# Patient Record
Sex: Female | Born: 1985 | Race: White | Hispanic: No | Marital: Single | Smoking: Current every day smoker
Health system: Southern US, Community
[De-identification: ages and names within clinical notes are randomized; demographics above are authoritative.]

## PROBLEM LIST (undated history)

## (undated) ENCOUNTER — Inpatient Hospital Stay (HOSPITAL_COMMUNITY): Payer: Self-pay

## (undated) DIAGNOSIS — F1910 Other psychoactive substance abuse, uncomplicated: Secondary | ICD-10-CM

## (undated) DIAGNOSIS — R569 Unspecified convulsions: Secondary | ICD-10-CM

## (undated) DIAGNOSIS — A5901 Trichomonal vulvovaginitis: Secondary | ICD-10-CM

## (undated) DIAGNOSIS — F909 Attention-deficit hyperactivity disorder, unspecified type: Secondary | ICD-10-CM

## (undated) DIAGNOSIS — F319 Bipolar disorder, unspecified: Secondary | ICD-10-CM

## (undated) HISTORY — PX: DILATION AND CURETTAGE OF UTERUS: SHX78

## (undated) HISTORY — DX: Unspecified convulsions: R56.9

---

## 2004-08-24 ENCOUNTER — Emergency Department (HOSPITAL_COMMUNITY): Admission: EM | Admit: 2004-08-24 | Discharge: 2004-08-25 | Payer: Self-pay | Admitting: Emergency Medicine

## 2004-11-25 ENCOUNTER — Observation Stay (HOSPITAL_COMMUNITY): Admission: EM | Admit: 2004-11-25 | Discharge: 2004-11-25 | Payer: Self-pay | Admitting: Emergency Medicine

## 2004-11-28 ENCOUNTER — Emergency Department (HOSPITAL_COMMUNITY): Admission: EM | Admit: 2004-11-28 | Discharge: 2004-11-28 | Payer: Self-pay | Admitting: Emergency Medicine

## 2005-06-09 ENCOUNTER — Emergency Department (HOSPITAL_COMMUNITY): Admission: EM | Admit: 2005-06-09 | Discharge: 2005-06-09 | Payer: Self-pay | Admitting: Emergency Medicine

## 2005-07-06 ENCOUNTER — Emergency Department (HOSPITAL_COMMUNITY): Admission: EM | Admit: 2005-07-06 | Discharge: 2005-07-06 | Payer: Self-pay | Admitting: Emergency Medicine

## 2005-11-28 ENCOUNTER — Emergency Department (HOSPITAL_COMMUNITY): Admission: EM | Admit: 2005-11-28 | Discharge: 2005-11-28 | Payer: Self-pay | Admitting: Emergency Medicine

## 2006-07-01 ENCOUNTER — Emergency Department (HOSPITAL_COMMUNITY): Admission: EM | Admit: 2006-07-01 | Discharge: 2006-07-01 | Payer: Self-pay | Admitting: Emergency Medicine

## 2006-07-02 ENCOUNTER — Emergency Department (HOSPITAL_COMMUNITY): Admission: EM | Admit: 2006-07-02 | Discharge: 2006-07-02 | Payer: Self-pay | Admitting: Emergency Medicine

## 2007-04-12 ENCOUNTER — Emergency Department (HOSPITAL_COMMUNITY): Admission: EM | Admit: 2007-04-12 | Discharge: 2007-04-12 | Payer: Self-pay | Admitting: Emergency Medicine

## 2009-03-17 ENCOUNTER — Inpatient Hospital Stay (HOSPITAL_COMMUNITY): Admission: AD | Admit: 2009-03-17 | Discharge: 2009-03-19 | Payer: Self-pay | Admitting: Obstetrics & Gynecology

## 2009-03-17 ENCOUNTER — Ambulatory Visit: Payer: Self-pay | Admitting: Advanced Practice Midwife

## 2009-09-29 ENCOUNTER — Emergency Department (HOSPITAL_COMMUNITY): Admission: EM | Admit: 2009-09-29 | Discharge: 2009-09-29 | Payer: Self-pay | Admitting: Emergency Medicine

## 2009-09-30 ENCOUNTER — Emergency Department (HOSPITAL_COMMUNITY): Admission: EM | Admit: 2009-09-30 | Discharge: 2009-09-30 | Payer: Self-pay | Admitting: Emergency Medicine

## 2009-12-17 ENCOUNTER — Emergency Department (HOSPITAL_COMMUNITY): Admission: EM | Admit: 2009-12-17 | Discharge: 2009-12-17 | Payer: Self-pay | Admitting: Emergency Medicine

## 2010-07-28 ENCOUNTER — Encounter: Payer: Self-pay | Admitting: Family Medicine

## 2010-09-07 ENCOUNTER — Emergency Department (HOSPITAL_COMMUNITY)
Admission: EM | Admit: 2010-09-07 | Discharge: 2010-09-08 | Disposition: A | Discharge status: Home / Self Care | Payer: Medicaid - Out of State | Attending: Emergency Medicine | Admitting: Emergency Medicine

## 2010-09-07 DIAGNOSIS — X503XXA Overexertion from repetitive movements, initial encounter: Secondary | ICD-10-CM | POA: Insufficient documentation

## 2010-09-07 DIAGNOSIS — Y92009 Unspecified place in unspecified non-institutional (private) residence as the place of occurrence of the external cause: Secondary | ICD-10-CM | POA: Insufficient documentation

## 2010-09-07 DIAGNOSIS — S335XXA Sprain of ligaments of lumbar spine, initial encounter: Secondary | ICD-10-CM | POA: Insufficient documentation

## 2010-09-07 DIAGNOSIS — N390 Urinary tract infection, site not specified: Secondary | ICD-10-CM | POA: Insufficient documentation

## 2010-09-07 DIAGNOSIS — O2390 Unspecified genitourinary tract infection in pregnancy, unspecified trimester: Secondary | ICD-10-CM | POA: Insufficient documentation

## 2010-09-08 LAB — URINALYSIS, ROUTINE W REFLEX MICROSCOPIC
Glucose, UA: NEGATIVE mg/dL
Hgb urine dipstick: NEGATIVE
Specific Gravity, Urine: 1.02 (ref 1.005–1.030)
Urobilinogen, UA: 0.2 mg/dL (ref 0.0–1.0)

## 2010-09-08 LAB — URINE MICROSCOPIC-ADD ON

## 2010-09-19 ENCOUNTER — Emergency Department (HOSPITAL_COMMUNITY)
Admission: EM | Admit: 2010-09-19 | Discharge: 2010-09-19 | Disposition: A | Discharge status: Home / Self Care | Payer: Medicaid - Out of State | Attending: Emergency Medicine | Admitting: Emergency Medicine

## 2010-09-19 DIAGNOSIS — Z4802 Encounter for removal of sutures: Secondary | ICD-10-CM | POA: Insufficient documentation

## 2010-09-19 DIAGNOSIS — B86 Scabies: Secondary | ICD-10-CM | POA: Insufficient documentation

## 2010-09-23 LAB — DIFFERENTIAL
Basophils Relative: 1 % (ref 0–1)
Lymphs Abs: 1.8 10*3/uL (ref 0.7–4.0)
Monocytes Relative: 9 % (ref 3–12)
Neutro Abs: 8.3 10*3/uL — ABNORMAL HIGH (ref 1.7–7.7)
Neutrophils Relative %: 73 % (ref 43–77)

## 2010-09-23 LAB — URINALYSIS, ROUTINE W REFLEX MICROSCOPIC
Bilirubin Urine: NEGATIVE
Glucose, UA: NEGATIVE mg/dL
Hgb urine dipstick: NEGATIVE
Ketones, ur: NEGATIVE mg/dL
Protein, ur: NEGATIVE mg/dL
pH: 5.5 (ref 5.0–8.0)

## 2010-09-23 LAB — COMPREHENSIVE METABOLIC PANEL
Alkaline Phosphatase: 73 U/L (ref 39–117)
BUN: 3 mg/dL — ABNORMAL LOW (ref 6–23)
Calcium: 9.8 mg/dL (ref 8.4–10.5)
Glucose, Bld: 109 mg/dL — ABNORMAL HIGH (ref 70–99)
Total Protein: 7.4 g/dL (ref 6.0–8.3)

## 2010-09-23 LAB — POCT PREGNANCY, URINE: Preg Test, Ur: NEGATIVE

## 2010-09-23 LAB — CBC
HCT: 40.2 % (ref 36.0–46.0)
MCHC: 33.4 g/dL (ref 30.0–36.0)
RBC: 4.58 MIL/uL (ref 3.87–5.11)
RDW: 14.9 % (ref 11.5–15.5)
WBC: 11.4 10*3/uL — ABNORMAL HIGH (ref 4.0–10.5)

## 2010-09-23 LAB — GC/CHLAMYDIA PROBE AMP, GENITAL
Chlamydia, DNA Probe: NEGATIVE
GC Probe Amp, Genital: NEGATIVE

## 2010-09-23 LAB — WET PREP, GENITAL
Trich, Wet Prep: NONE SEEN
Yeast Wet Prep HPF POC: NONE SEEN

## 2010-09-23 LAB — URINE MICROSCOPIC-ADD ON

## 2010-09-29 LAB — COMPREHENSIVE METABOLIC PANEL
ALT: 9 U/L (ref 0–35)
AST: 11 U/L (ref 0–37)
Albumin: 3.5 g/dL (ref 3.5–5.2)
CO2: 25 mEq/L (ref 19–32)
Calcium: 8.9 mg/dL (ref 8.4–10.5)
GFR calc Af Amer: >60 mL/min (ref >60)
GFR calc non Af Amer: >60 mL/min (ref >60)
Sodium: 136 mEq/L (ref 135–145)
Total Protein: 7 g/dL (ref 6.0–8.3)

## 2010-09-29 LAB — DIFFERENTIAL
Eosinophils Absolute: 0.1 10*3/uL (ref 0.0–0.7)
Eosinophils Relative: 1 % (ref 0–5)
Lymphocytes Relative: 18 % (ref 12–46)
Lymphs Abs: 1.7 10*3/uL (ref 0.7–4.0)
Monocytes Absolute: 0.8 10*3/uL (ref 0.1–1.0)
Monocytes Relative: 8 % (ref 3–12)

## 2010-09-29 LAB — CBC
MCHC: 34.6 g/dL (ref 30.0–36.0)
Platelets: 260 10*3/uL (ref 150–400)
RBC: 4.24 MIL/uL (ref 3.87–5.11)

## 2010-10-11 LAB — RAPID URINE DRUG SCREEN, HOSP PERFORMED
Amphetamines: NOT DETECTED
Benzodiazepines: POSITIVE — AB
Tetrahydrocannabinol: NOT DETECTED

## 2010-10-11 LAB — HEPATITIS B SURFACE ANTIGEN: Hepatitis B Surface Ag: NEGATIVE

## 2010-10-11 LAB — RPR: RPR Ser Ql: NONREACTIVE

## 2010-10-11 LAB — RAPID HIV SCREEN (WH-MAU): Rapid HIV Screen: NONREACTIVE

## 2010-10-11 LAB — CBC
Hemoglobin: 12.8 g/dL (ref 12.0–15.0)
RBC: 4.17 MIL/uL (ref 3.87–5.11)

## 2010-10-11 LAB — TYPE AND SCREEN
ABO/RH(D): B POS
Antibody Screen: NEGATIVE

## 2010-11-22 NOTE — H&P (Signed)
NAMEBENNYE, NIX NO.:  [id number]   MEDICAL RECORD NO.:  [id number]          PATIENT TYPE:  OBV   LOCATION:  A416                          FACILITY:  [facility]   PHYSICIAN:  Tilda Burrow, M.D. DATE OF BIRTH:  July 16, 1985   DATE OF ADMISSION:  11/25/2004  DATE OF DISCHARGE:  LH                                HISTORY & PHYSICAL   ADMISSION DIAGNOSES:  Pregnancy 7 weeks' gestation, suspected incomplete  abortion, acknowledged recent cocaine use, sedation secondary to narcotic  administered in the emergency room.   HISTORY OF PRESENT ILLNESS:  This 25 year old female, gravida 1, para 0,  presents to the emergency room in a highly agitated state, complaining of  abdominal pain with heavy vaginal bleeding. CBC was obtained which was in  normal range. She is quantitative HCG of 1,700. Hemoglobin 14.7. She was  agitated and had difficulty cooperating with exam and received morphine for  pain and dramatic sedation effect from the medications.   PHYSICAL EXAMINATION:  VITAL SIGNS:  Blood pressure 137/81, heart rate 112.  She is oriented x3 with normal judgment.  HEENT:  Unremarkable.  ABDOMEN:  Soft, nontender, without masses.  PELVIC:  Pelvic exam shows severe active bleeding, open cervix, tissue in  the os.   PLAN:  Proceed with suction D&C once the patient is alert enough to give  consent. At the time of admission, she could not cooperate with GYN exam but  will proceed if tissue can be extracted we may be able to avoid the OR.      JVF/MEDQ  D:  11/25/2004  T:  11/25/2004  Job:  [id number]

## 2010-11-22 NOTE — H&P (Signed)
Penny Hale, Penny Hale NO.:  [id number]   MEDICAL RECORD NO.:  [id number]          PATIENT TYPE:  OBV   LOCATION:  A416                          FACILITY:  [facility]   PHYSICIAN:  Tilda Burrow, M.D. DATE OF BIRTH:  Dec 17, 1985   DATE OF ADMISSION:  11/25/2004  DATE OF DISCHARGE:  LH                                HISTORY & PHYSICAL   HISTORY OF PRESENT ILLNESS:  Penny Hale is a 25 year old gravida 2, para 0.  Last menstrual period was in April to the best of her knowledge who presents  to the emergency room bleeding with clots for two days.   PAST MEDICAL HISTORY:  Medical history is positive for cocaine use. She does  admit use of cocaine very recently within the past 24 to 36 hours. She  denies drinking. She denies other recreational drug use.   PAST SURGICAL HISTORY:  Surgical history is negative.   PHYSICAL EXAMINATION:  VITAL SIGNS:  Stable.  GENITOURINARY:  She has a small to moderate amount of vaginal bleeding with  clots on exam.  GENERAL:  She is afebrile at the present time. The patient on admission to  the unit was very incoherent and not oriented to time, place. She was almost  what appeared to be in a cocaine psychosis. The patient was given Narcan at  approximately 10:00 and at 12 p.m. now she is more responsive to  questioning.   ASSESSMENT:  Pregnancy at 45 weeks with incomplete abortion.   PLAN:  We are going to get a vaginal probe ultrasound, get a UDS, IV for  hydration and also has a port for any medications that might be needed for  bleeding. Get a blood type and Rh. Dr. Emelda Fear is going to keep her NPO  prior to his assessment.      DL/MEDQ  D:  81/19/1478  T:  11/25/2004  Job:  [id number]

## 2010-11-22 NOTE — Op Note (Signed)
NAMEJAYDEEN, DARLEY NO.:  [id number]   MEDICAL RECORD NO.:  [id number]          PATIENT TYPE:  OBV   LOCATION:  A416                          FACILITY:  [facility]   PHYSICIAN:  Tilda Burrow, M.D. DATE OF BIRTH:  1985/12/13   DATE OF PROCEDURE:  11/25/2004  DATE OF DISCHARGE:                                 OPERATIVE REPORT   PREOPERATIVE DIAGNOSES:  Incomplete abortion, polysubstance abuse.   POSTOPERATIVE DIAGNOSIS:  Incomplete abortion, polysubstance abuse.   PROCEDURE:  Suction, dilation, and curettage.   SURGEON:  Dr. Emelda Fear.   ASSISTANT:  None.   ANESTHESIA:  Maryan Puls, CRNA.   COMPLICATIONS:  None.   FINDINGS:  Uterus sounding to 9 cm with generous tissue fragments and clots  consistent with incomplete AB. Preoperative hemoglobin 14, hematocrit 42.  Urine drug screen positive for opiates, cocaine, benzodiazepines, and THC.   DETAILS OF PROCEDURE:  The patient was taken to the operating room, prepped  and draped for lower abdominal surgery. The speculum was inserted, the  cervix prepped, and paracervical block applied. A single-toothed tenaculum  was used to grasp the anterior lip of the cervix which was then sounded to  10 [place], allowing smooth sharp curettage. The suction curettage confirmed  satisfactory evacuation of the uterus, and there were a couple of large  clumps of tissue that were extracted.   The patient then had smooth sharp curettage confirming the empty uterine  cavity, and then the procedure was considered successfully completed. The  patient went to the recovery room in good condition. Blood type is Rh  positive.      JVF/MEDQ  D:  11/25/2004  T:  11/25/2004  Job:  [id number]

## 2010-11-22 NOTE — Discharge Summary (Signed)
Penny Hale, Penny Hale NO.:  [id number]   MEDICAL RECORD NO.:  [id number]          PATIENT TYPE:  EMS   LOCATION:  ED                            FACILITY:  [facility]   PHYSICIAN:  Tilda Burrow, M.D. DATE OF BIRTH:  December 30, 1985   DATE OF ADMISSION:  11/28/2004  DATE OF DISCHARGE:  05/25/2006LH                                 DISCHARGE SUMMARY   ADMISSION DIAGNOSES:  1.  Pregnancy [redacted] weeks gestation.  2.  Suspected incomplete abortion.  3.  Acknowledged recent cocaine use.  4.  Multiple drug addictions.   DISCHARGE DIAGNOSES:  1.  Pregnancy [redacted] weeks gestation.  2.  Suspected incomplete abortion.  3.  Acknowledged recent cocaine use.  4.  Multiple drug addictions.   PROCEDURE:  1.  On Nov 25, 2004 emergency room evaluation.  2.  Suction dilation and curettage.   DISCHARGE MEDICATIONS:  Depo-Provera 150 mg IM given in the recovery room.   FOLLOW UP:  One week in our office for contraception counseling.   HOSPITAL COURSE:  Sabrin presented to the emergency room in a rather  pitiful condition, bleeding heavily, complaining of abdominal pain and  vaginal bleeding. CBC was within range. Quantitative HCG was 1,700.  Hemoglobin 14.7. The patient was agitated. Had difficulty cooperating with  examinations and presented certainly in dramatic presentation both from her  medication effect and then the dramatic sedation from medications  administered for pain. Urine drug screen returned positive for cocaine,  marijuana, benzodiazepines as well as opiates, the last of which may have  been related to medications used in the hospital. The patient underwent D&C  without difficulty and was discharged home. If she follows up in our office,  it is our general belief that the issue of multiple drug addictions will be  brought up for discussion and patient encouraged to consider a  rehabilitation program for her polysubstance abuse.    JVF/MEDQ  D:  12/05/2004  T:  12/05/2004   Job:  [id number]

## 2011-05-12 ENCOUNTER — Emergency Department (HOSPITAL_COMMUNITY)
Admission: EM | Admit: 2011-05-12 | Discharge: 2011-05-12 | Disposition: A | Discharge status: Home / Self Care | Payer: Medicaid - Out of State

## 2012-01-26 ENCOUNTER — Encounter (HOSPITAL_COMMUNITY): Payer: Self-pay | Admitting: *Deleted

## 2012-01-26 ENCOUNTER — Emergency Department (HOSPITAL_COMMUNITY)
Admission: EM | Admit: 2012-01-26 | Discharge: 2012-01-26 | Disposition: A | Discharge status: Home / Self Care | Payer: Medicaid - Out of State | Attending: Emergency Medicine | Admitting: Emergency Medicine

## 2012-01-26 DIAGNOSIS — K0889 Other specified disorders of teeth and supporting structures: Secondary | ICD-10-CM

## 2012-01-26 DIAGNOSIS — K089 Disorder of teeth and supporting structures, unspecified: Secondary | ICD-10-CM | POA: Insufficient documentation

## 2012-01-26 DIAGNOSIS — F17200 Nicotine dependence, unspecified, uncomplicated: Secondary | ICD-10-CM | POA: Insufficient documentation

## 2012-01-26 MED ORDER — OXYCODONE-ACETAMINOPHEN 5-325 MG PO TABS: 1.0000 | ORAL_TABLET | Freq: Four times a day (QID) | ORAL | Status: AC | PRN

## 2012-01-26 MED ORDER — PENICILLIN V POTASSIUM 500 MG PO TABS: 500.0000 mg | ORAL_TABLET | Freq: Three times a day (TID) | ORAL | Status: AC

## 2012-01-26 NOTE — ED Notes (Signed)
Pt reports dental pain to upper back, states she needs all of her teeth pulled, has appt. States that she thinks she has an infection, reports swelling and pain, no fevers.

## 2012-01-26 NOTE — ED Provider Notes (Signed)
History   This chart was scribed for No att. providers found by Toya Smothers. The patient was seen in room TR07C/TR07C. Patient's care was started at 1158.  CSN: [id number]  Arrival date & time 01/26/12  1158   None     Chief Complaint  Patient presents with  . Dental Pain   Patient is a 26 y.o. female presenting with tooth pain. The history is provided by the patient. No language interpreter was used.  Dental PainPrimary symptoms do not include headaches, fever, shortness of breath or cough.    Penny Hale is a 26 y.o. female who presents to the Emergency Department complaining of gradual onset constant upper right dental pain onset several days ago. Pt reports associate swelling and believes that she may have an infection. Pt is a current everyday smoker and has not treated symptoms prior to the arrival to the Emergency Department.  History reviewed. No pertinent past medical history.  History reviewed. No pertinent past surgical history.  History reviewed. No pertinent family history.  History  Substance Use Topics  . Smoking status: Current Everyday Smoker -- 0.2 packs/day  . Smokeless tobacco: Not on file  . Alcohol Use: No   Review of Systems  Constitutional: Negative for fever.  HENT: Positive for dental problem. Negative for rhinorrhea.        Dental Pain  Eyes: Negative for pain.  Respiratory: Negative for cough and shortness of breath.   Cardiovascular: Negative for chest pain.  Gastrointestinal: Negative for nausea, vomiting, abdominal pain and diarrhea.  Genitourinary: Negative for dysuria.  Musculoskeletal: Negative for back pain.  Skin: Negative for rash.  Neurological: Negative for weakness and headaches.    Allergies  Review of patient's allergies indicates no known allergies.  Home Medications   Current Outpatient Rx  Name Route Sig Dispense Refill  . ACETAMINOPHEN 500 MG PO TABS Oral Take 1,000 mg by mouth every 4 (four) hours as needed. For pain     . TRAMADOL HCL 50 MG PO TABS Oral Take 50 mg by mouth every 6 (six) hours as needed. For pain      BP 115/75  Pulse 100  Temp 97.2 F (36.2 C) (Oral)  Resp 16  SpO2 98%  LMP 01/26/2012  Physical Exam  ED Course  Procedures (including critical care time) DIAGNOSTIC STUDIES: Oxygen Saturation is 98% on room air, normal by my interpretation.    COORDINATION OF CARE: 1432- Evaluated Pt. Pt is without distress. Discussed follow-up with dentist. Will treat with Percocet (every 6 hours) and Veetid (3x daily).   Labs Reviewed - No data to display No results found.   No diagnosis found.    MDM  Will prescribe pain meds, antibiotics.  To follow up with dentist if no better.     I personally performed the services described in this documentation, which was scribed in my presence. The recorded information has been reviewed and considered.        Geoffery Lyons, MD 01/26/12 [phone]

## 2012-04-05 ENCOUNTER — Encounter (HOSPITAL_COMMUNITY): Payer: Self-pay | Admitting: *Deleted

## 2012-04-05 ENCOUNTER — Emergency Department (HOSPITAL_COMMUNITY)
Admission: EM | Admit: 2012-04-05 | Discharge: 2012-04-05 | Disposition: A | Discharge status: Home / Self Care | Payer: Medicaid - Out of State | Attending: Emergency Medicine | Admitting: Emergency Medicine

## 2012-04-05 DIAGNOSIS — K0889 Other specified disorders of teeth and supporting structures: Secondary | ICD-10-CM

## 2012-04-05 DIAGNOSIS — K029 Dental caries, unspecified: Secondary | ICD-10-CM | POA: Insufficient documentation

## 2012-04-05 DIAGNOSIS — F17200 Nicotine dependence, unspecified, uncomplicated: Secondary | ICD-10-CM | POA: Insufficient documentation

## 2012-04-05 MED ORDER — HYDROCODONE-ACETAMINOPHEN 5-325 MG PO TABS
1.0000 | ORAL_TABLET | Freq: Once | ORAL | Status: AC
  Administered 2012-04-05: 1 via ORAL
  Filled 2012-04-05: qty 1

## 2012-04-05 MED ORDER — HYDROCODONE-ACETAMINOPHEN 5-325 MG PO TABS: ORAL_TABLET | ORAL | Status: DC

## 2012-04-05 MED ORDER — AMOXICILLIN 250 MG PO CAPS
500.0000 mg | ORAL_CAPSULE | Freq: Once | ORAL | Status: AC
  Administered 2012-04-05: 500 mg via ORAL
  Filled 2012-04-05: qty 2

## 2012-04-05 MED ORDER — AMOXICILLIN 500 MG PO CAPS: 500.0000 mg | ORAL_CAPSULE | Freq: Three times a day (TID) | ORAL | Status: DC

## 2012-04-05 NOTE — ED Provider Notes (Signed)
History     CSN: [id number]  Arrival date & time 04/05/12  1400   First MD Initiated Contact with Patient 04/05/12 1436      Chief Complaint  Patient presents with  . Dental Pain    (Consider location/radiation/quality/duration/timing/severity/associated sxs/prior treatment) Patient is a 26 y.o. female presenting with tooth pain. The history is provided by the patient.  Dental PainThe primary symptoms include mouth pain. Primary symptoms do not include oral bleeding, headaches, fever, shortness of breath, sore throat, angioedema or cough. The symptoms began 3 to 5 days ago. The symptoms are worsening. The symptoms are new. The symptoms occur constantly.  Mouth pain occurs constantly. Mouth pain is worsening. Affected locations include: teeth and gum(s).  Additional symptoms include: dental sensitivity to temperature and gum tenderness. Additional symptoms do not include: gum swelling, purulent gums, trismus, jaw pain, facial swelling, trouble swallowing, ear pain and swollen glands. Medical issues include: smoking and periodontal disease.    History reviewed. No pertinent past medical history.  History reviewed. No pertinent past surgical history.  History reviewed. No pertinent family history.  History  Substance Use Topics  . Smoking status: Current Every Day Smoker -- 0.2 packs/day  . Smokeless tobacco: Not on file  . Alcohol Use: No    OB History    Grav Para Term Preterm Abortions TAB SAB Ect Mult Living                  Review of Systems  Constitutional: Negative for fever and appetite change.  HENT: Positive for dental problem. Negative for ear pain, congestion, sore throat, facial swelling, trouble swallowing, neck pain and neck stiffness.   Eyes: Negative for pain and visual disturbance.  Respiratory: Negative for cough and shortness of breath.   Neurological: Negative for dizziness, facial asymmetry and headaches.  Hematological: Negative for adenopathy.  All  other systems reviewed and are negative.    Allergies  Review of patient's allergies indicates no known allergies.  Home Medications   Current Outpatient Rx  Name Route Sig Dispense Refill  . ACETAMINOPHEN 500 MG PO TABS Oral Take 1,000 mg by mouth every 4 (four) hours as needed. For pain    . ASPIRIN-SALICYLAMIDE-CAFFEINE 325-95-16 MG PO TABS Oral Take 1 Package by mouth every 4 (four) hours as needed. For dental pain    . BENZOCAINE 20 % MT LIQD Mouth/Throat Use as directed 1 application in the mouth or throat every 2 (two) hours as needed. For dental pain      BP 118/67  Pulse 83  Temp 98.3 F (36.8 C) (Oral)  Resp 20  Ht 5\' 5"  (1.651 m)  Wt 163 lb (73.936 kg)  BMI 27.12 kg/m2  SpO2 100%  LMP 03/12/2012  Physical Exam  Nursing note and vitals reviewed. Constitutional: She is oriented to person, place, and time. She appears well-developed and well-nourished. No distress.  HENT:  Head: Normocephalic and atraumatic. No trismus in the jaw.  Right Ear: Tympanic membrane and ear canal normal.  Left Ear: Tympanic membrane and ear canal normal.  Mouth/Throat: Uvula is midline, oropharynx is clear and moist and mucous membranes are normal. Dental caries present. No dental abscesses or uvula swelling.       Multiple dental caries.  No obvious abscess, no facial edema or trismus  Neck: Normal range of motion. Neck supple.  Cardiovascular: Normal rate, regular rhythm and normal heart sounds.   No murmur heard. Pulmonary/Chest: Effort normal and breath sounds normal.  Musculoskeletal: Normal  range of motion.  Lymphadenopathy:    She has no cervical adenopathy.  Neurological: She is alert and oriented to person, place, and time. She exhibits normal muscle tone. Coordination normal.  Skin: Skin is warm and dry.    ED Course  Procedures (including critical care time)  Labs Reviewed - No data to display      MDM     Multiple dental caries and widespread dental disease.  No periapical abscess facial edema or trismus.   The patient appears reasonably screened and/or stabilized for discharge and I doubt any other medical condition or other [facility] requiring further screening, evaluation, or treatment in the ED at this time prior to discharge.   Prescribed: Amoxil norco #20  Nafeesa Dils L. Grete Bosko, [place] 04/07/12 1702

## 2012-04-05 NOTE — ED Notes (Signed)
Dental pain rt upper teeth.

## 2012-04-08 NOTE — ED Provider Notes (Signed)
Medical screening examination/treatment/procedure(s) were performed by non-physician practitioner and as supervising physician I was immediately available for consultation/collaboration.   Mercy Malena, MD 04/08/12 0704 

## 2012-07-05 ENCOUNTER — Encounter (HOSPITAL_COMMUNITY): Payer: Self-pay

## 2012-07-05 ENCOUNTER — Emergency Department (HOSPITAL_COMMUNITY)
Admission: EM | Admit: 2012-07-05 | Discharge: 2012-07-06 | Disposition: A | Discharge status: Home / Self Care | Payer: Medicaid - Out of State | Attending: Emergency Medicine | Admitting: Emergency Medicine

## 2012-07-05 DIAGNOSIS — O4690 Antepartum hemorrhage, unspecified, unspecified trimester: Secondary | ICD-10-CM | POA: Insufficient documentation

## 2012-07-05 DIAGNOSIS — Z7982 Long term (current) use of aspirin: Secondary | ICD-10-CM | POA: Insufficient documentation

## 2012-07-05 DIAGNOSIS — F17200 Nicotine dependence, unspecified, uncomplicated: Secondary | ICD-10-CM | POA: Insufficient documentation

## 2012-07-05 DIAGNOSIS — O99891 Other specified diseases and conditions complicating pregnancy: Secondary | ICD-10-CM | POA: Insufficient documentation

## 2012-07-05 DIAGNOSIS — R109 Unspecified abdominal pain: Secondary | ICD-10-CM | POA: Insufficient documentation

## 2012-07-05 DIAGNOSIS — O209 Hemorrhage in early pregnancy, unspecified: Secondary | ICD-10-CM

## 2012-07-05 NOTE — ED Notes (Signed)
Pt 4 months pregnant that began to bleed this morning and began having abdominal cramps.

## 2012-07-06 LAB — CBC
HCT: 40.7 % (ref 36.0–46.0)
MCH: 30.4 pg (ref 26.0–34.0)
MCV: 89.1 fL (ref 78.0–100.0)
Platelets: 249 10*3/uL (ref 150–400)
RDW: 14.1 % (ref 11.5–15.5)

## 2012-07-06 LAB — HCG, QUANTITATIVE, PREGNANCY: hCG, Beta Chain, Quant, S: 2486 m[IU]/mL — ABNORMAL HIGH (ref <5)

## 2012-07-06 LAB — BASIC METABOLIC PANEL
BUN: 7 mg/dL (ref 6–23)
CO2: 26 mEq/L (ref 19–32)
Calcium: 9.3 mg/dL (ref 8.4–10.5)
Chloride: 101 mEq/L (ref 96–112)
Creatinine, Ser: 0.51 mg/dL (ref 0.50–1.10)

## 2012-07-06 MED ORDER — SODIUM CHLORIDE 0.9 % IV BOLUS (SEPSIS)
1000.0000 mL | Freq: Once | INTRAVENOUS | Status: AC
  Administered 2012-07-06: 1000 mL via INTRAVENOUS

## 2012-07-06 NOTE — Progress Notes (Addendum)
Call from [facility] at 0135 with G6P4 pt at [redacted]w[redacted]d presenting with vag bleeding and cramping. Nurse states she can hear FHR but having difficulty tracing on monitor. Suggest use hand held doppler. Pt is seen in [place] and has an [place] scheduled for 1/8.  At 0230, FHR tracing by [facility] RN 160-170. Monitors removed.

## 2012-07-06 NOTE — ED Provider Notes (Signed)
History     CSN: [id number]  Arrival date & time 07/05/12  2227   First MD Initiated Contact with Patient 07/06/12 0014      Chief Complaint  Patient presents with  . Vaginal Bleeding  . Abdominal Cramping    (Consider location/radiation/quality/duration/timing/severity/associated sxs/prior treatment) HPI Penny Hale is a 26 y.o. female, G6, P4 A1,who presents to the Emergency Department with vaginal bleeding and cramping that began yesterday morning. She woke yesterday with dark scant spotting . Wore a pad throughout the day with continued dark discharge. No recent intercourse. Cramping off and on all day. Denies fever, chills, back pain, nausea, vomiting.      History reviewed. No pertinent past medical history.  History reviewed. No pertinent past surgical history.  No family history on file.  History  Substance Use Topics  . Smoking status: Current Every Day Smoker -- 0.2 packs/day  . Smokeless tobacco: Not on file  . Alcohol Use: No    OB History    Grav Para Term Preterm Abortions TAB SAB Ect Mult Living   1               Review of Systems  Genitourinary:       Abdominal cramping    Allergies  Review of patient's allergies indicates no known allergies.  Home Medications   Current Outpatient Rx  Name  Route  Sig  Dispense  Refill  . ACETAMINOPHEN 500 MG PO TABS   Oral   Take 1,000 mg by mouth every 4 (four) hours as needed. For pain         . AMOXICILLIN 500 MG PO CAPS   Oral   Take 1 capsule (500 mg total) by mouth 3 (three) times daily.   30 capsule   0   . ASPIRIN-SALICYLAMIDE-CAFFEINE 325-95-16 MG PO TABS   Oral   Take 1 Package by mouth every 4 (four) hours as needed. For dental pain         . BENZOCAINE 20 % MT LIQD   Mouth/Throat   Use as directed 1 application in the mouth or throat every 2 (two) hours as needed. For dental pain         . HYDROCODONE-ACETAMINOPHEN 5-325 MG PO TABS      Take one-two tabs po q 4-6 hrs prn  pain   20 tablet   0     BP 105/65  Pulse 86  Temp 97.6 F (36.4 C) (Oral)  Resp 16  Ht 5\' 4"  (1.626 m)  Wt 150 lb (68.04 kg)  BMI 25.75 kg/m2  SpO2 100%  LMP 03/12/2012  Physical Exam  Nursing note and vitals reviewed. Constitutional: She appears well-developed and well-nourished.       Awake, alert, nontoxic appearance.  HENT:  Head: Atraumatic.  Eyes: Right eye exhibits no discharge. Left eye exhibits no discharge.  Neck: Neck supple.  Cardiovascular: Normal heart sounds.   Pulmonary/Chest: Effort normal. She exhibits no tenderness.  Abdominal: Soft. There is no tenderness. There is no rebound.  Genitourinary:       Dark blood in vaginal vault on speculum exam. Gravid uterus.  Musculoskeletal: She exhibits no tenderness.       Baseline ROM, no obvious new focal weakness.  Neurological:       Mental status and motor strength appears baseline for patient and situation.  Skin: No rash noted.  Psychiatric: She has a normal mood and affect.    ED Course  Procedures (including critical  care time) Results for orders placed during the hospital encounter of 07/05/12  CBC      Component Value Range   WBC 7.0  4.0 - 10.5 K/uL   RBC 4.57  3.87 - 5.11 MIL/uL   Hemoglobin 13.9  12.0 - 15.0 g/dL   HCT 16.1  09.6 - 04.5 %   MCV 89.1  78.0 - 100.0 fL   MCH 30.4  26.0 - 34.0 pg   MCHC 34.2  30.0 - 36.0 g/dL   RDW 40.9  81.1 - 91.4 %   Platelets 249  150 - 400 K/uL  BASIC METABOLIC PANEL      Component Value Range   Sodium 137  135 - 145 mEq/L   Potassium 3.5  3.5 - 5.1 mEq/L   Chloride 101  96 - 112 mEq/L   CO2 26  19 - 32 mEq/L   Glucose, Bld 86  70 - 99 mg/dL   BUN 7  6 - 23 mg/dL   Creatinine, Ser 7.82  0.50 - 1.10 mg/dL   Calcium 9.3  8.4 - 95.6 mg/dL   GFR calc non Af Amer >90  >90 mL/min   GFR calc Af Amer >90  >90 mL/min  HCG, QUANTITATIVE, PREGNANCY      Component Value Range   hCG, Beta Chain, Quant, S 2486 (*) <5 mIU/mL    0100 By dates, LMP 02/05/12.  Patient is 21 weeks 5 days. 0110 Moved to Room 1 for placement on TOCO monitored by [facility]. 0230 TOCO with FHR 156-180 c/w doppler rate.No. Contractions. Women's confirmed that options with the current HCG are to repeat value in 48 hours, have her see her doctor, arrange for [place]. She  receives her pre natal care in [place]. Discussed options with her. She would like to follow up with her doctor in [place] tomorrow.  0315 Completed TOCO monitoring.   MDM   Gravid patient at 21 weeks here with vaginal bleeding that began this morning associated with abdominal cramping. TOCO with FHR 156-180. Patient monitored for 2 hours. No contractions. Serum hCG 2486 a little low for dates. She will have her doctor in [place] recheck number in 48 hours. No increase in vaginal discharge. Pt stable in ED with no significant deterioration in condition.The patient appears reasonably screened and/or stabilized for discharge and I doubt any other medical condition or other [facility] requiring further screening, evaluation, or treatment in the ED at this time prior to discharge.  MDM Reviewed: nursing note and vitals Interpretation: labs  CRITICAL CARE Performed by: Annamarie Dawley Total critical care time: 40 Critical care time was exclusive of separately billable procedures and treating other patients. Critical care was necessary to treat or prevent imminent or life-threatening deterioration. Critical care was time spent personally by me on the following activities: development of treatment plan with patient and/or surrogate as well as nursing, discussions with consultants, evaluation of patient's response to treatment, examination of patient, obtaining history from patient or surrogate, ordering and performing treatments and interventions, ordering and review of laboratory studies, ordering and review of radiographic studies, pulse oximetry and re-evaluation of patient's condition.         Nicoletta Dress.  Colon Branch, MD 07/06/12 2130

## 2012-07-06 NOTE — ED Notes (Signed)
Phone call to [facility] and discussion with on call RN related to patients fetal monitoring, blood level for hCG.  Advised MD of RN comments

## 2012-07-06 NOTE — ED Notes (Signed)
MD in to talk with patient.  IV discontinued.  Patient has attempted to contact significant other.  Getting dressed and awaiting discharge.

## 2012-07-23 ENCOUNTER — Emergency Department (HOSPITAL_COMMUNITY): Payer: Medicaid - Out of State

## 2012-07-23 ENCOUNTER — Encounter (HOSPITAL_COMMUNITY): Payer: Self-pay | Admitting: Emergency Medicine

## 2012-07-23 ENCOUNTER — Emergency Department (HOSPITAL_COMMUNITY)
Admission: EM | Admit: 2012-07-23 | Discharge: 2012-07-23 | Disposition: A | Discharge status: Home / Self Care | Payer: Medicaid - Out of State | Attending: Emergency Medicine | Admitting: Emergency Medicine

## 2012-07-23 DIAGNOSIS — W19XXXA Unspecified fall, initial encounter: Secondary | ICD-10-CM

## 2012-07-23 DIAGNOSIS — S59909A Unspecified injury of unspecified elbow, initial encounter: Secondary | ICD-10-CM | POA: Insufficient documentation

## 2012-07-23 DIAGNOSIS — S6990XA Unspecified injury of unspecified wrist, hand and finger(s), initial encounter: Secondary | ICD-10-CM | POA: Insufficient documentation

## 2012-07-23 DIAGNOSIS — S40019A Contusion of unspecified shoulder, initial encounter: Secondary | ICD-10-CM | POA: Insufficient documentation

## 2012-07-23 DIAGNOSIS — F17200 Nicotine dependence, unspecified, uncomplicated: Secondary | ICD-10-CM | POA: Insufficient documentation

## 2012-07-23 DIAGNOSIS — W108XXA Fall (on) (from) other stairs and steps, initial encounter: Secondary | ICD-10-CM | POA: Insufficient documentation

## 2012-07-23 DIAGNOSIS — T148XXA Other injury of unspecified body region, initial encounter: Secondary | ICD-10-CM

## 2012-07-23 DIAGNOSIS — S59919A Unspecified injury of unspecified forearm, initial encounter: Secondary | ICD-10-CM | POA: Insufficient documentation

## 2012-07-23 DIAGNOSIS — Y9289 Other specified places as the place of occurrence of the external cause: Secondary | ICD-10-CM | POA: Insufficient documentation

## 2012-07-23 DIAGNOSIS — Y93E2 Activity, laundry: Secondary | ICD-10-CM | POA: Insufficient documentation

## 2012-07-23 MED ORDER — OXYCODONE-ACETAMINOPHEN 5-325 MG PO TABS
2.0000 | ORAL_TABLET | Freq: Once | ORAL | Status: AC
  Administered 2012-07-23: 2 via ORAL
  Filled 2012-07-23: qty 2

## 2012-07-23 NOTE — ED Notes (Signed)
Pt. Fell down 6 steps l;ast night rt. Shoulder pain with brusiing, no deformity noted., +ROM, rt. Wrist is swollen and bruised. Dec. ROM due to pain

## 2012-07-23 NOTE — Progress Notes (Signed)
Orthopedic Tech Progress Note Patient Details:  Penny Hale 12/03/1985 [id number]  Ortho Devices Type of Ortho Device: Arm sling Ortho Device/Splint Location: RIGHT ARM SLING Ortho Device/Splint Interventions: Application   Cammer, Mickie Bail 07/23/2012, 3:37 PM

## 2012-07-23 NOTE — ED Provider Notes (Signed)
History     CSN: [id number]  Arrival date & time 07/23/12  1234   First MD Initiated Contact with Patient 07/23/12 1309      Chief Complaint  Patient presents with  . Fall    (Consider location/radiation/quality/duration/timing/severity/associated sxs/prior treatment) HPI 27 year old female presents to the emergency department with chief complaint of right wrist and shoulder pain.  Patient states that she was carrying a basket of laundry down basement stairs when her slipper was caught and she fell forward onto her right arm.  She complains of bruising and pain in the right distal forearm and right shoulder.  She is able to use her right hand.  She denies any numbness or tingling. Past medical history significant for recent miscarriage 13 days ago.  She was seen at [facility]. History reviewed. No pertinent past medical history.  History reviewed. No pertinent past surgical history.  No family history on file.  History  Substance Use Topics  . Smoking status: Current Every Day Smoker -- 0.2 packs/day  . Smokeless tobacco: Not on file  . Alcohol Use: No    OB History    Grav Para Term Preterm Abortions TAB SAB Ect Mult Living   1               Review of Systems Ten systems reviewed and are negative for acute change, except as noted in the HPI.   Allergies  Review of patient's allergies indicates no known allergies.  Home Medications   Current Outpatient Rx  Name  Route  Sig  Dispense  Refill  . ACETAMINOPHEN 500 MG PO TABS   Oral   Take 1,000 mg by mouth every 4 (four) hours as needed. For pain           BP 101/85  Pulse 77  Temp 98.2 F (36.8 C)  Resp 18  SpO2 100%  LMP 07/12/2012  Breastfeeding? Unknown  Physical Exam Physical Exam  Nursing note and vitals reviewed. Constitutional: She is oriented to person, place, and time. She appears well-developed and well-nourished. No distress.  HENT:  Head: Normocephalic and atraumatic.  Eyes:  Conjunctivae normal and EOM are normal. Pupils are equal, round, and reactive to light. No scleral icterus.  Neck: Normal range of motion.  Cardiovascular: Normal rate, regular rhythm and normal heart sounds.  Exam reveals no gallop and no friction rub.   No murmur heard. Pulmonary/Chest: Effort normal and breath sounds normal. No respiratory distress.  Abdominal: Soft. Bowel sounds are normal. She exhibits no distension and no mass. There is no tenderness. There is no guarding.  Musculoskeletal:  tenderness swelling and ecchymosis of the distal right forearm on the dorsal surface.  No open wounds.  Extremely tender to palpation.  She has good grip strength and neurovascularly intact in the radial pulse.  She also has tenderness and swelling and ecchymosis of the right supraclavicular area tender to palpation over the right clavicle.  Range of motion is reduced due to pain.   Neurological: She is alert and oriented to person, place, and time.  Skin: Skin is warm and dry. She is not diaphoretic.    ED Course  Procedures (including critical care time)  Labs Reviewed - No data to display No results found.   1. Fall   2. Hematoma       MDM  2:45 PM BP 101/85  Pulse 77  Temp 98.2 F (36.8 C)  Resp 18  SpO2 100%  LMP 07/12/2012  Breastfeeding? Unknown  Patient with fall and possible fractures.  She is that x-ray for evaluation   Xray negative. Will d/c with sling. tx with tylenol/advil. Ortho f/u if sxs worsen or do not resolve. Discussed reasons to seek immediate care. Patient expresses understanding and agrees with plan.       Arthor Captain, PA-C 07/25/12 [phone]

## 2012-07-26 NOTE — ED Provider Notes (Signed)
Medical screening examination/treatment/procedure(s) were performed by non-physician practitioner and as supervising physician I was immediately available for consultation/collaboration.   Wreatha Sturgeon, MD 07/26/12 1443 

## 2013-07-07 NOTE — L&D Delivery Note (Signed)
Delivery Note At 2:23 PM a viable female was delivered via  (Presentation ROA).  APGAR: 8 ,9 ; weight pending.   Placenta status: Intact, Spontaneous.  Cord: 3 vessels  with the following complications: none .   Anesthesia:  epidural Episiotomy: none Lacerations: none Suture Repair: None Est. Blood Loss (mL): 350cc  Mom to postpartum.  Baby to Couplet care / Skin to Skin.  Called to delivery. Mother pushed over 10 minutes. [facility]. Cord clamped and cut. Active management of 3rd stage with traction and Pitocin. Placenta delivered intact with 3v cord. No tears. EBL 350cc. Counts correct. Hemostatic.    Melancon, Hillery HunterCaleb G 01/10/2014, 2:46 PM  I was present for and supervised the delivery of this newborn. I agree with above except that a nuchal x1 was reduced on the perineum and baby was delivered through a body cord.  Mom requested baby prior to us leaving. SW to be consulted for polysubstance abuse.    Dalores Weger, Redmond BasemanKELI L, MD

## 2013-07-10 ENCOUNTER — Emergency Department (HOSPITAL_COMMUNITY)
Admission: EM | Admit: 2013-07-10 | Discharge: 2013-07-10 | Disposition: A | Discharge status: Home / Self Care | Payer: Medicaid - Out of State | Attending: Emergency Medicine | Admitting: Emergency Medicine

## 2013-07-10 ENCOUNTER — Encounter (HOSPITAL_COMMUNITY): Payer: Self-pay | Admitting: Emergency Medicine

## 2013-07-10 DIAGNOSIS — K0889 Other specified disorders of teeth and supporting structures: Secondary | ICD-10-CM

## 2013-07-10 DIAGNOSIS — F17200 Nicotine dependence, unspecified, uncomplicated: Secondary | ICD-10-CM | POA: Insufficient documentation

## 2013-07-10 DIAGNOSIS — K089 Disorder of teeth and supporting structures, unspecified: Secondary | ICD-10-CM | POA: Insufficient documentation

## 2013-07-10 MED ORDER — HYDROCODONE-ACETAMINOPHEN 5-325 MG PO TABS: 2.0000 | ORAL_TABLET | ORAL | Status: DC | PRN

## 2013-07-10 MED ORDER — PENICILLIN V POTASSIUM 500 MG PO TABS: 500.0000 mg | ORAL_TABLET | Freq: Four times a day (QID) | ORAL | Status: AC

## 2013-07-10 NOTE — Discharge Instructions (Signed)

## 2013-07-10 NOTE — ED Provider Notes (Signed)
CSN: [id number]     Arrival date & time 07/10/13  1047 History   First MD Initiated Contact with Patient 07/10/13 1150     Chief Complaint  Patient presents with  . Dental Pain   (Consider location/radiation/quality/duration/timing/severity/associated sxs/prior Treatment) Patient is a 28 y.o. female presenting with tooth pain. The history is provided by the patient. No language interpreter was used.  Dental Pain Location:  Lower Quality:  Aching Severity:  Moderate Onset quality:  Gradual Duration:  2 weeks Progression:  Worsening Context: abscess   Previous work-up:  Dental exam Worsened by:  Nothing tried Ineffective treatments:  None tried Associated symptoms: no facial pain     History reviewed. No pertinent past medical history. History reviewed. No pertinent past surgical history. Family History  Problem Relation Age of Onset  . COPD Mother    History  Substance Use Topics  . Smoking status: Current Every Day Smoker -- 1.00 packs/day    Types: Cigarettes  . Smokeless tobacco: Not on file  . Alcohol Use: No   OB History   Grav Para Term Preterm Abortions TAB SAB Ect Mult Living   6 3 3  2  2         Review of Systems  Unable to perform ROS HENT: Positive for dental problem.   All other systems reviewed and are negative.    Allergies  Review of patient's allergies indicates no known allergies.  Home Medications   Current Outpatient Rx  Name  Route  Sig  Dispense  Refill  . acetaminophen (TYLENOL) 500 MG tablet   Oral   Take 1,000 mg by mouth every 4 (four) hours as needed. For pain         . HYDROcodone-acetaminophen (NORCO/VICODIN) 5-325 MG per tablet   Oral   Take 2 tablets by mouth every 4 (four) hours as needed.   10 tablet   0   . penicillin v potassium (VEETID) 500 MG tablet   Oral   Take 1 tablet (500 mg total) by mouth 4 (four) times daily.   40 tablet   0    BP 114/73  Pulse 89  Temp(Src) 98.3 F (36.8 C) (Oral)  Resp 16  Ht 5'  5" (1.651 m)  Wt 135 lb (61.236 kg)  BMI 22.47 kg/m2  SpO2 100%  LMP 06/19/2013 Physical Exam  Nursing note and vitals reviewed. Constitutional: She is oriented to person, place, and time. She appears well-developed and well-nourished.  HENT:  Head: Normocephalic and atraumatic.  Multiple decayed teeth  Eyes: Conjunctivae are normal.  Neck: Normal range of motion. Neck supple.  Cardiovascular: Normal rate.   Pulmonary/Chest: Effort normal.  Neurological: She is alert and oriented to person, place, and time.  Skin: Skin is warm.  Psychiatric: She has a normal mood and affect.    ED Course  Procedures (including critical care time) Labs Review Labs Reviewed - No data to display Imaging Review No results found.  EKG Interpretation   None       MDM   1. Toothache   PCn and hydrocodone.   Pt is followed by an oral surgeon.   Pt reports she is waiting for medicaid for dental extractions    Elson AreasLeslie K Rodney Yera, PA-C 07/10/13 1311

## 2013-07-10 NOTE — ED Notes (Signed)
Pt c/o bilat back molar pain, mult dental caries.

## 2013-07-11 NOTE — ED Provider Notes (Signed)
Medical screening examination/treatment/procedure(s) were performed by non-physician practitioner and as supervising physician I was immediately available for consultation/collaboration.  EKG Interpretation   None        Gilda Creasehristopher J. Nezzie Manera, MD 07/11/13 [phone]

## 2013-08-19 ENCOUNTER — Emergency Department (HOSPITAL_COMMUNITY)
Admission: EM | Admit: 2013-08-19 | Discharge: 2013-08-19 | Disposition: A | Discharge status: Home / Self Care | Payer: MEDICAID | Attending: Emergency Medicine | Admitting: Emergency Medicine

## 2013-08-19 ENCOUNTER — Encounter (HOSPITAL_COMMUNITY): Payer: Self-pay | Admitting: Emergency Medicine

## 2013-08-19 DIAGNOSIS — F1410 Cocaine abuse, uncomplicated: Secondary | ICD-10-CM | POA: Insufficient documentation

## 2013-08-19 DIAGNOSIS — F1210 Cannabis abuse, uncomplicated: Secondary | ICD-10-CM | POA: Insufficient documentation

## 2013-08-19 DIAGNOSIS — F17200 Nicotine dependence, unspecified, uncomplicated: Secondary | ICD-10-CM | POA: Insufficient documentation

## 2013-08-19 DIAGNOSIS — F1310 Sedative, hypnotic or anxiolytic abuse, uncomplicated: Secondary | ICD-10-CM | POA: Insufficient documentation

## 2013-08-19 DIAGNOSIS — F1910 Other psychoactive substance abuse, uncomplicated: Secondary | ICD-10-CM

## 2013-08-19 DIAGNOSIS — F909 Attention-deficit hyperactivity disorder, unspecified type: Secondary | ICD-10-CM | POA: Insufficient documentation

## 2013-08-19 HISTORY — DX: Attention-deficit hyperactivity disorder, unspecified type: F90.9

## 2013-08-19 HISTORY — DX: Bipolar disorder, unspecified: F31.9

## 2013-08-19 LAB — COMPREHENSIVE METABOLIC PANEL
ALT: 11 U/L (ref 0–35)
AST: 14 U/L (ref 0–37)
Albumin: 3.4 g/dL — ABNORMAL LOW (ref 3.5–5.2)
Alkaline Phosphatase: 81 U/L (ref 39–117)
BUN: 6 mg/dL (ref 6–23)
CO2: 28 mEq/L (ref 19–32)
Calcium: 8.9 mg/dL (ref 8.4–10.5)
Chloride: 101 mEq/L (ref 96–112)
Creatinine, Ser: 0.57 mg/dL (ref 0.50–1.10)
GFR calc Af Amer: >90 mL/min (ref >90)
GFR calc non Af Amer: >90 mL/min (ref >90)
Glucose, Bld: 64 mg/dL — ABNORMAL LOW (ref 70–99)
Potassium: 3.9 mEq/L (ref 3.7–5.3)
Sodium: 139 mEq/L (ref 137–147)
Total Bilirubin: 0.3 mg/dL (ref 0.3–1.2)
Total Protein: 7.5 g/dL (ref 6.0–8.3)

## 2013-08-19 LAB — URINALYSIS, ROUTINE W REFLEX MICROSCOPIC
Bilirubin Urine: NEGATIVE
Glucose, UA: NEGATIVE mg/dL
Hgb urine dipstick: NEGATIVE
Ketones, ur: NEGATIVE mg/dL
Nitrite: NEGATIVE
Protein, ur: NEGATIVE mg/dL
Specific Gravity, Urine: 1.02 (ref 1.005–1.030)
Urobilinogen, UA: 0.2 mg/dL (ref 0.0–1.0)
pH: 7 (ref 5.0–8.0)

## 2013-08-19 LAB — RAPID URINE DRUG SCREEN, HOSP PERFORMED
Amphetamines: NOT DETECTED
Barbiturates: NOT DETECTED
Benzodiazepines: POSITIVE — AB
Cocaine: POSITIVE — AB
Opiates: NOT DETECTED
Tetrahydrocannabinol: POSITIVE — AB

## 2013-08-19 LAB — ETHANOL: Alcohol, Ethyl (B): <11 mg/dL (ref 0–11)

## 2013-08-19 LAB — CBC
HCT: 38.7 % (ref 36.0–46.0)
Hemoglobin: 13.3 g/dL (ref 12.0–15.0)
MCH: 32.2 pg (ref 26.0–34.0)
MCHC: 34.4 g/dL (ref 30.0–36.0)
MCV: 93.7 fL (ref 78.0–100.0)
Platelets: 245 10*3/uL (ref 150–400)
RBC: 4.13 MIL/uL (ref 3.87–5.11)
RDW: 13.6 % (ref 11.5–15.5)
WBC: 10.8 10*3/uL — ABNORMAL HIGH (ref 4.0–10.5)

## 2013-08-19 LAB — URINE MICROSCOPIC-ADD ON

## 2013-08-19 NOTE — ED Notes (Signed)
Pt walked out without signing.  [facility]

## 2013-08-19 NOTE — Discharge Instructions (Signed)
°Emergency Department Resource Guide °1) Find a Doctor and Pay Out of Pocket °Although you won't have to find out who is covered by your insurance plan, it is a good idea to ask around and get recommendations. You will then need to call the office and see if the doctor you have chosen will accept you as a new patient and what types of options they offer for patients who are self-pay. Some doctors offer discounts or will set up payment plans for their patients who do not have insurance, but you will need to ask so you aren't surprised when you get to your appointment. ° °2) Contact Your Local Health Department °Not all health departments have doctors that can see patients for sick visits, but many do, so it is worth a call to see if yours does. If you don't know where your local health department is, you can check in your phone book. The CDC also has a tool to help you locate your state's health department, and many state websites also have listings of all of their local health departments. ° °3) Find a Walk-in Clinic °If your illness is not likely to be very severe or complicated, you may want to try a walk in clinic. These are popping up all over the country in pharmacies, drugstores, and shopping centers. They're usually staffed by nurse practitioners or physician assistants that have been trained to treat common illnesses and complaints. They're usually fairly quick and inexpensive. However, if you have serious medical issues or chronic medical problems, these are probably not your best option. ° °No Primary Care Doctor: °- Call Health Connect at  832-8000 - they can help you locate a primary care doctor that  accepts your insurance, provides certain services, etc. °- Physician Referral Service- 1-800-533-3463 ° °Chronic Pain Problems: °Organization         Address  Phone   Notes  °[facility]  (336) 297-2271 Patients need to be referred by their primary care doctor.  ° °Medication  Assistance: °Organization         Address  Phone   Notes  °[place] [facility] [address] °[place], [place] [address] (336) 641-8030 --Must be a resident of [place] °-- Must have NO insurance coverage whatsoever (no Medicaid/ Medicare, etc.) °-- The pt. MUST have a primary care doctor that directs their care regularly and follows them in the community °  °[facility]  (866) 331-1348   °[employer]  (888) 892-1162   ° °Agencies that provide inexpensive medical care: °Organization         Address  Phone   Notes  °[facility]  (336) 832-8035   °[facility]    (336) 832-7272   °[facility] [address] °[place], [place] [address] (336) 832-4777   °[facility] [address], °[place] (336) 271-4999   °[facility]    (336) 373-0678   °[facility]    (336) 272-1050   °[facility] ° [address], [place] Phone:  (336) 832-4444, Fax:  (336) 832-4440 Hours of Operation:  9 am - 6 pm, M-F.  Also accepts Medicaid/Medicare and self-pay.  °[facility] ° [address], [place] Phone: (336) 832-3150, Fax: (336) [phone]. Hours of Operation:  8:30 am - 5:30 pm, M-F.  Also accepts Medicaid and self-pay.  °[facility] [place] [address], [place] Phone: (336) 878-6027   °[facility] [address], [place], [place] (336)723-1848, Ext. 123 Mondays & Thursdays: 7-9 AM.  First 15 patients are seen on a first come, first serve basis. °  ° °Medicaid-accepting [place] Providers: ° °Organization         Address  Phone   Notes  °[facility] [address], [place] (336) 641-2100 Also accepts self-pay patients.  °[facility] [address], [place] ° (336) 856-9996   °[facility] [address], [place]  (336) 288-8857   °[facility] [address] [place] [address], [place] (336) 299-7000   °Veita Bland [address], [place]  ° (336) 373-1557 Only accepts [place] Access Medicaid patients after they have their name applied to their card.  ° °Self-Pay (no insurance) in [place]: ° °Organization         Address  Phone   Notes  °Sickle Cell Patients, [facility] [address], [place] (336) 832-1970   °[facility] [address], [place] (336) 832-4400   ° [facility] ° [address], [place] (336) 992-4800   °[facility]/Dr. Osei-Bonsu ° [address] [place] [address], [place] or [address], [place] (336) [phone] Phone number for both [place] and [place] locations is the same.  °[facility] [address], [place] (336) 299-0000   °[facility] [address] [place] [address], [place] or [address] (336) 852-7530 °(336) 878-2260   °[facility] [address],  (336) 350-1642, phone; (336) 294-5005, fax Sees patients 1st and 3rd Saturday of every month.  Must not qualify for public or private insurance (i.e. Medicaid, Medicare, [place] Health Choice, Veterans' Benefits) • Household income should be no more than 200% of the poverty level •The clinic cannot treat you if you are pregnant or think you are pregnant • Sexually transmitted diseases are not treated at the clinic.  ° ° °Dental Care: °Organization         Address  Phone  Notes  °[place] [facility] [address],  (336) 641-6152 Accepts children up to age 21 who are enrolled in Medicaid or [place] Health Choice; pregnant women with a Medicaid card; and children who have applied for Medicaid or [place] Health Choice, but were declined, whose parents can pay a reduced fee at time of service.  °[place]  [facility] [place]  [address], [place] (336) 641-7733 Accepts children up to age 21 who are enrolled in Medicaid or [place] Health Choice; pregnant women with a Medicaid card; and children who have applied for Medicaid or [place] Health Choice, but were declined, whose parents can pay a reduced fee at time of service.  °[facility] ° [address],  (336) 641-4533 Patients are seen by appointment only. Walk-ins are not accepted. [facility] will see patients 18 years of age and older. °Monday - Tuesday (8am-5pm) °Most Wednesdays (8:30-5pm) °$30 per visit, cash only  °[facility] ° [address], [place] (336) 641-4533 Patients are seen by appointment only. Walk-ins are not accepted. [facility] will see patients 18 years of age and older. °One   Wednesday Evening (Monthly: Volunteer Based).  $30 per visit, cash only  °[facility]  (919) 537-3737 for adults; Children under age 4, call Graduate Pediatric Dentistry at ([phone]) [phone]. Children aged 4-14, please call (919) [phone] to request a pediatric application. ° Dental services are provided in all areas of dental care including fillings, crowns and bridges, complete and partial dentures, implants, gum treatment, root canals, and extractions. Preventive care is also provided. Treatment is provided to both adults and children. °Patients are selected via a lottery and there is often a waiting list. °  °[facility] [address], °[place] ° (336) 763-8833 [email or web address] °  °[facility] [address], [place], [place] (336)723-1848, Ext. 123 Second and Fourth Thursday of each month, opens at 6:30 AM; Clinic ends at 9 AM.  Patients are seen on a first-come first-served basis, and a limited number are seen during each clinic.  ° °[facility] ° [address], [place], [place] (336) 723-7904    Eligibility Requirements °You must have lived in [place], [place], or [place] counties for at least the last three months. °  You cannot be eligible for state or federal sponsored healthcare insurance, including Veterans Administration, Medicaid, or Medicare. °  You generally cannot be eligible for healthcare insurance through your employer.  °  How to apply: °Eligibility screenings are held every Tuesday and Wednesday afternoon from 1:00 pm until 4:00 pm. You do not need an appointment for the interview!  °[facility] [address], [place], [place] 336-631-2330   °[facility] [facility]  336-342-8273   °[facility]  336-703-3100   °[facility]  336-570-6415   ° °Behavioral Health Resources in the Community: °Intensive Outpatient Programs °Organization         Address  Phone  Notes  °[facility] [address], [place], [place] 336-878-6098   °[facility] [address], [place], [place] 336-832-9800   °[facility] [address], [place], [place] ° 336-882-2125   °[facility] [address],  °[place], [place] 1-800-853-5163 or 336-641-4981   °Substance Abuse Resources °Organization         Address  Phone  Notes  °[facility]  336-882-2125   °[facility]  336-784-9470   °[facility]  336-285-9073   °[facility]  336-845-3988   °Residential & Outpatient Substance Abuse Program  1-800-659-3381   °Psychological Services °Organization         Address  Phone  Notes  °[facility]  336- 832-9600   °[facility]  336- 378-7881   °[facility] [address], [place] 1-800-853-5163 or 336-641-4981   ° °Mobile Crisis Teams °Organization         Address  Phone  Notes  °[facility], Mobile Crisis Care Unit  1-877-626-1772   °Assertive °[facility] ° [address]  [place], [place] 336-834-9664   °Sharon DeEsch [address] °[place] 336-554-5454   ° °Self-Help/Support Groups °Organization         Address  Phone             Notes  °[facility] - variety of support groups  336- 373-1402 Call for more information  °Narcotics Anonymous (NA), [facility] [address], °[place]  2 meetings at this location  ° °  Residential Treatment Programs °Organization         Address  Phone  Notes  °[facility] [address],    °[place]  1-866-801-8205   °[facility] ° [address], [place], [place] 704-293-8524   °[facility] [address], [place] 336-845-3988 Admissions: 8am-3pm M-F  °[facility] [address]    °[place], [place] 336-841-1104   °[facility] [address], [place], [place] 336-379-7146   °[facility] [address]  °[place], [place] 336-285-9073   °[facility] - Intensive Outpatient [address], [place], [place] 336-852-3033   °[facility] [address]  °[place], [place] 1-877-615-2722 or 336-784-9470   °[facility] ([facility]) [address] [place], [place] 336-227-7417 Accepts Medicaid  °[facility] [address]  °[place] 1-800-659-3381 Substance Abuse/Addiction Treatment  ° °[place] Behavioral Health Resources °Organization         Address  Phone  Notes  °[facility]  (888) 581-9988   °Julie Brannon, PhD [address] [place], [place]   (336) 349-5553 or (336) 951-0000   °[facility]   [address] °[place], [place] (336) 349-4454   °[facility] [address], [place], [place] (336) 342-8316 Insurance/Medicaid/sponsorship through [facility]  °[facility] [address]                                    [place], [place] (336) 342-8316 Therapy/tele-psych/case    °[facility] [address]  ° [place], [place] (336) 349-2233    °Dr. Arfeen  (336) 349-4544   °[facility] [place]  [employer] [place] [facility] 1) [address], [place] °2) [address], [place] °3)  [address], [place] (336) 349-3220 °(336) 342-7768 ° °(336) 342-8140   °[place] Child Abuse [facility] (336) 342-1394 or (336) 342-3537 (After Hours)    ° ° °

## 2013-08-19 NOTE — ED Notes (Signed)
[facility]. Per patient [facility] but because she tested positive for benzos and narcotics patient is being sent here to be cleared so that she can be sent to another facility [place] ([facility]). Patient denies any SI or HI.

## 2013-08-19 NOTE — ED Provider Notes (Signed)
CSN: [id number]     Arrival date & time 08/19/13  0945 History  This chart was scribed for Penny Razor, MD by Leone Payor, ED Scribe. This patient was seen in room APA17/APA17 and the patient's care was started 10:09 AM.     Chief Complaint  Patient presents with  . Medical Clearance      The history is provided by the patient. No language interpreter was used.    HPI Comments: MARCO ADELSON is a 28 y.o. female with past medical history of ADHD, bipolar 1, manic-depressive who presents to the Emergency Department requesting medical clearance. Pt states she was sent here by [facility] to be medically cleared before she can start a detox program at [facility]. She states that she was supposed to go to [place] today for detox from crack cocaine but was sent here because she tested positive for benzo and opiates. She states that the xanax and opiate use is only occasional and is only seeking help with cocaine dependence. She states her last cocaine use was 4 days ago. She denies SI or HI at this time. Last used xanax earlier today. Not prescribed to her. Gets from friend. Percocet was prescribed to her recently after ED visit for dental pain. Pt reports past hx of heavy opiate abuse, but not currently. She denies any symptoms or withdrawal.      Past Medical History  Diagnosis Date  . ADHD (attention deficit hyperactivity disorder)   . Bipolar 1 disorder   . Manic-depressive    Past Surgical History  Procedure Laterality Date  . Dilation and curettage of uterus     Family History  Problem Relation Age of Onset  . COPD Mother    History  Substance Use Topics  . Smoking status: Current Every Day Smoker -- 1.00 packs/day for 12 years    Types: Cigarettes  . Smokeless tobacco: Never Used  . Alcohol Use: No   OB History   Grav Para Term Preterm Abortions TAB SAB Ect Mult Living   6 3 3  2  2         Review of Systems  Psychiatric/Behavioral: Negative for suicidal ideas.  All other systems  reviewed and are negative.      Allergies  Review of patient's allergies indicates no known allergies.  Home Medications   Current Outpatient Rx  Name  Route  Sig  Dispense  Refill  . acetaminophen (TYLENOL) 500 MG tablet   Oral   Take 1,000 mg by mouth every 4 (four) hours as needed. For pain         . HYDROcodone-acetaminophen (NORCO/VICODIN) 5-325 MG per tablet   Oral   Take 2 tablets by mouth every 4 (four) hours as needed.   10 tablet   0    BP 118/73  Pulse 103  Temp(Src) 98.3 F (36.8 C) (Oral)  Resp 18  Ht 5\' 4"  (1.626 m)  Wt 154 lb (69.854 kg)  BMI 26.42 kg/m2  SpO2 100% Physical Exam  Nursing note and vitals reviewed. Constitutional: She is oriented to person, place, and time. She appears well-developed and well-nourished.  HENT:  Head: Normocephalic and atraumatic.  Small area of excoriation to posterior left scalp. No evidence of abscess or cellulitis.   Cardiovascular: Normal rate, regular rhythm and normal heart sounds.   Pulmonary/Chest: Effort normal and breath sounds normal. No respiratory distress. She has no wheezes. She has no rales.  Abdominal: She exhibits no distension.  Neurological: She is alert  and oriented to person, place, and time.  Skin: Skin is warm and dry.  Psychiatric: She has a normal mood and affect. Her speech is normal. Thought content normal. Cognition and memory are normal.  Speech is clear and content appropriate. Thought process is logical and goal directed     ED Course  Procedures (including critical care time)  DIAGNOSTIC STUDIES: Oxygen Saturation is 100% on RA, normal by my interpretation.    COORDINATION OF CARE: 10:10 AM Discussed treatment plan with pt at bedside and pt agreed to plan.   Labs Review Labs Reviewed  URINE RAPID DRUG SCREEN (HOSP PERFORMED) - Abnormal; Notable for the following:    Cocaine POSITIVE (*)    Benzodiazepines POSITIVE (*)    Tetrahydrocannabinol POSITIVE (*)    All other  components within normal limits  CBC - Abnormal; Notable for the following:    WBC 10.8 (*)    All other components within normal limits  COMPREHENSIVE METABOLIC PANEL - Abnormal; Notable for the following:    Glucose, Bld 64 (*)    Albumin 3.4 (*)    All other components within normal limits  URINALYSIS, ROUTINE W REFLEX MICROSCOPIC - Abnormal; Notable for the following:    Leukocytes, UA TRACE (*)    All other components within normal limits  URINE MICROSCOPIC-ADD ON - Abnormal; Notable for the following:    Squamous Epithelial / LPF MANY (*)    Bacteria, UA MANY (*)    All other components within normal limits  ETHANOL   Imaging Review No results found.  EKG Interpretation   None       MDM   Final diagnoses:  Polysubstance abuse    28 year old female with polysubstance abuse. Referred for medical clearance prior to detox. She's been medically cleared at this time. No evidence of acute psychosis, significant depression, suicidal or homicidal ideation. No indication for psychiatric consultation. Labs obtained at request of reviewing facility. UA with bacteria and trace leukocytes. Many epithelial cells though. No specific urinary complaints and will defer tx.  Patient has been medically cleared at this time to further pursue detox.  I personally preformed the services scribed in my presence. The recorded information has been reviewed is accurate. Penny RazorStephen Ahmani Daoud, MD.   Penny RazorStephen Katianne Barre, MD 08/19/13 [phone]

## 2013-10-03 ENCOUNTER — Emergency Department (HOSPITAL_COMMUNITY): Payer: Medicaid Other

## 2013-10-03 ENCOUNTER — Encounter (HOSPITAL_COMMUNITY): Payer: Self-pay | Admitting: Emergency Medicine

## 2013-10-03 ENCOUNTER — Inpatient Hospital Stay (HOSPITAL_COMMUNITY)
Admission: EM | Admit: 2013-10-03 | Discharge: 2013-10-06 | DRG: 781 | Disposition: A | Discharge status: Home / Self Care | Payer: Medicaid Other | Attending: Obstetrics & Gynecology | Admitting: Obstetrics & Gynecology

## 2013-10-03 DIAGNOSIS — O0933 Supervision of pregnancy with insufficient antenatal care, third trimester: Secondary | ICD-10-CM

## 2013-10-03 DIAGNOSIS — A499 Bacterial infection, unspecified: Secondary | ICD-10-CM | POA: Diagnosis present

## 2013-10-03 DIAGNOSIS — O2302 Infections of kidney in pregnancy, second trimester: Secondary | ICD-10-CM

## 2013-10-03 DIAGNOSIS — O0930 Supervision of pregnancy with insufficient antenatal care, unspecified trimester: Secondary | ICD-10-CM

## 2013-10-03 DIAGNOSIS — F319 Bipolar disorder, unspecified: Secondary | ICD-10-CM | POA: Diagnosis present

## 2013-10-03 DIAGNOSIS — A599 Trichomoniasis, unspecified: Secondary | ICD-10-CM

## 2013-10-03 DIAGNOSIS — O99340 Other mental disorders complicating pregnancy, unspecified trimester: Secondary | ICD-10-CM | POA: Diagnosis present

## 2013-10-03 DIAGNOSIS — O98819 Other maternal infectious and parasitic diseases complicating pregnancy, unspecified trimester: Secondary | ICD-10-CM | POA: Diagnosis present

## 2013-10-03 DIAGNOSIS — F1290 Cannabis use, unspecified, uncomplicated: Secondary | ICD-10-CM

## 2013-10-03 DIAGNOSIS — O2390 Unspecified genitourinary tract infection in pregnancy, unspecified trimester: Principal | ICD-10-CM | POA: Diagnosis present

## 2013-10-03 DIAGNOSIS — B9689 Other specified bacterial agents as the cause of diseases classified elsewhere: Secondary | ICD-10-CM | POA: Diagnosis present

## 2013-10-03 DIAGNOSIS — Z3490 Encounter for supervision of normal pregnancy, unspecified, unspecified trimester: Secondary | ICD-10-CM

## 2013-10-03 DIAGNOSIS — Z5900 Homelessness unspecified: Secondary | ICD-10-CM

## 2013-10-03 DIAGNOSIS — N760 Acute vaginitis: Secondary | ICD-10-CM | POA: Diagnosis present

## 2013-10-03 DIAGNOSIS — F1310 Sedative, hypnotic or anxiolytic abuse, uncomplicated: Secondary | ICD-10-CM

## 2013-10-03 DIAGNOSIS — O23592 Infection of other part of genital tract in pregnancy, second trimester: Secondary | ICD-10-CM

## 2013-10-03 DIAGNOSIS — A5901 Trichomonal vulvovaginitis: Secondary | ICD-10-CM | POA: Diagnosis present

## 2013-10-03 DIAGNOSIS — N10 Acute tubulo-interstitial nephritis: Secondary | ICD-10-CM | POA: Diagnosis present

## 2013-10-03 DIAGNOSIS — R109 Unspecified abdominal pain: Secondary | ICD-10-CM | POA: Diagnosis present

## 2013-10-03 DIAGNOSIS — F1910 Other psychoactive substance abuse, uncomplicated: Secondary | ICD-10-CM | POA: Diagnosis present

## 2013-10-03 HISTORY — DX: Trichomonal vulvovaginitis: A59.01

## 2013-10-03 HISTORY — DX: Other psychoactive substance abuse, uncomplicated: F19.10

## 2013-10-03 LAB — CBC WITH DIFFERENTIAL/PLATELET
BASOS ABS: 0 10*3/uL (ref 0.0–0.1)
BASOS PCT: 0 % (ref 0–1)
Eosinophils Absolute: 0.1 10*3/uL (ref 0.0–0.7)
Eosinophils Relative: 1 % (ref 0–5)
HEMATOCRIT: 35.5 % — AB (ref 36.0–46.0)
Hemoglobin: 12.1 g/dL (ref 12.0–15.0)
LYMPHS PCT: 11 % — AB (ref 12–46)
Lymphs Abs: 1.2 10*3/uL (ref 0.7–4.0)
MCH: 31.7 pg (ref 26.0–34.0)
MCHC: 34.1 g/dL (ref 30.0–36.0)
MCV: 92.9 fL (ref 78.0–100.0)
MONO ABS: 1.2 10*3/uL — AB (ref 0.1–1.0)
Monocytes Relative: 10 % (ref 3–12)
NEUTROS ABS: 9.3 10*3/uL — AB (ref 1.7–7.7)
NEUTROS PCT: 79 % — AB (ref 43–77)
Platelets: 201 10*3/uL (ref 150–400)
RBC: 3.82 MIL/uL — ABNORMAL LOW (ref 3.87–5.11)
RDW: 12.7 % (ref 11.5–15.5)
WBC: 11.8 10*3/uL — AB (ref 4.0–10.5)

## 2013-10-03 LAB — OB RESULTS CONSOLE HIV ANTIBODY (ROUTINE TESTING): HIV: NONREACTIVE

## 2013-10-03 LAB — URINALYSIS, ROUTINE W REFLEX MICROSCOPIC
Bilirubin Urine: NEGATIVE
GLUCOSE, UA: NEGATIVE mg/dL
KETONES UR: NEGATIVE mg/dL
Nitrite: POSITIVE — AB
PH: 5.5 (ref 5.0–8.0)
Specific Gravity, Urine: 1.02 (ref 1.005–1.030)
Urobilinogen, UA: 0.2 mg/dL (ref 0.0–1.0)

## 2013-10-03 LAB — COMPREHENSIVE METABOLIC PANEL
ALT: 8 U/L (ref 0–35)
AST: 11 U/L (ref 0–37)
Albumin: 2.7 g/dL — ABNORMAL LOW (ref 3.5–5.2)
Alkaline Phosphatase: 108 U/L (ref 39–117)
BILIRUBIN TOTAL: 0.2 mg/dL — AB (ref 0.3–1.2)
BUN: 4 mg/dL — ABNORMAL LOW (ref 6–23)
CHLORIDE: 97 meq/L (ref 96–112)
CO2: 26 meq/L (ref 19–32)
CREATININE: 0.51 mg/dL (ref 0.50–1.10)
Calcium: 8.8 mg/dL (ref 8.4–10.5)
GFR calc Af Amer: >90 mL/min (ref >90)
GFR calc non Af Amer: >90 mL/min (ref >90)
Glucose, Bld: 75 mg/dL (ref 70–99)
POTASSIUM: 3.4 meq/L — AB (ref 3.7–5.3)
Sodium: 135 mEq/L — ABNORMAL LOW (ref 137–147)
Total Protein: 7.3 g/dL (ref 6.0–8.3)

## 2013-10-03 LAB — OB RESULTS CONSOLE GC/CHLAMYDIA
Chlamydia: NEGATIVE
GC PROBE AMP, GENITAL: NEGATIVE

## 2013-10-03 LAB — ABO/RH: ABO/RH(D): B POS

## 2013-10-03 LAB — URINE MICROSCOPIC-ADD ON

## 2013-10-03 LAB — OB RESULTS CONSOLE RPR: RPR: NONREACTIVE

## 2013-10-03 LAB — LIPASE, BLOOD: Lipase: 26 U/L (ref 11–59)

## 2013-10-03 LAB — WET PREP, GENITAL: YEAST WET PREP: NONE SEEN

## 2013-10-03 LAB — PREGNANCY, URINE: PREG TEST UR: POSITIVE — AB

## 2013-10-03 LAB — HCG, QUANTITATIVE, PREGNANCY: HCG, BETA CHAIN, QUANT, S: 9167 m[IU]/mL — AB (ref <5)

## 2013-10-03 MED ORDER — ACETAMINOPHEN 325 MG PO TABS
650.0000 mg | ORAL_TABLET | ORAL | Status: DC | PRN
  Administered 2013-10-04 – 2013-10-06 (×9): 650 mg via ORAL
  Filled 2013-10-03 (×9): qty 2

## 2013-10-03 MED ORDER — ACETAMINOPHEN 500 MG PO TABS
1000.0000 mg | ORAL_TABLET | Freq: Once | ORAL | Status: AC
  Administered 2013-10-03: 1000 mg via ORAL
  Filled 2013-10-03: qty 2

## 2013-10-03 MED ORDER — DEXTROSE 5 % IV SOLN
1.0000 g | Freq: Once | INTRAVENOUS | Status: AC
  Administered 2013-10-03: 1 g via INTRAVENOUS
  Filled 2013-10-03: qty 10

## 2013-10-03 MED ORDER — ZOLPIDEM TARTRATE 5 MG PO TABS: 5.0000 mg | ORAL_TABLET | Freq: Every evening | ORAL | Status: DC | PRN

## 2013-10-03 MED ORDER — METRONIDAZOLE 500 MG PO TABS
2000.0000 mg | ORAL_TABLET | Freq: Once | ORAL | Status: AC
  Administered 2013-10-03: 2000 mg via ORAL
  Filled 2013-10-03: qty 4

## 2013-10-03 MED ORDER — SODIUM CHLORIDE 0.45 % IV SOLN: INTRAVENOUS | Status: DC

## 2013-10-03 MED ORDER — PRENATAL MULTIVITAMIN CH
1.0000 | ORAL_TABLET | Freq: Every day | ORAL | Status: DC
  Administered 2013-10-04 – 2013-10-06 (×3): 1 via ORAL
  Filled 2013-10-03 (×5): qty 1

## 2013-10-03 MED ORDER — DOCUSATE SODIUM 100 MG PO CAPS
100.0000 mg | ORAL_CAPSULE | Freq: Two times a day (BID) | ORAL | Status: DC
  Administered 2013-10-04 – 2013-10-06 (×4): 100 mg via ORAL
  Filled 2013-10-03 (×8): qty 1

## 2013-10-03 MED ORDER — CALCIUM CARBONATE ANTACID 500 MG PO CHEW
2.0000 | CHEWABLE_TABLET | ORAL | Status: DC | PRN
  Administered 2013-10-04: 400 mg via ORAL
  Filled 2013-10-03: qty 1
  Filled 2013-10-03: qty 2

## 2013-10-03 NOTE — ED Notes (Signed)
Pt c/o flank pain and foul smelling urine 1 week ago.  Pt also  Reports no period for approx 1 year.   Took pregnancy test 3 months ago but was negative.  Pt says has been feeling something moving in her abd for the past 2 weeks.

## 2013-10-03 NOTE — ED Provider Notes (Signed)
CSN: [id number]     Arrival date & time 10/03/13  1431 History   First MD Initiated Contact with Patient 10/03/13 1513     Chief Complaint  Patient presents with  . Flank Pain      HPI Pt was seen at 1530. Per pt, c/o gradual onset and persistence of constant dysuria for the past 2 weeks. Pt states her urine "smells bad." States she has developed left sided low back "pain" that radiates into the left side of her abd over the past 1 week. Pt states she also has had several intermittent episodes of N/V/D for the past 3 days. Pt also states she "hasn't had a period in at least 1 year," possible LMP 08/2012. States she took a pregnancy test 3 months ago which was negative, none since. States for the past 2 weeks she has "felt something moving around inside me" and questions if she is pregnant. Hx [id number] ("probably because of my drug use").  Denies fevers, no rash, no vaginal bleeding/discharge, no black or blood in stools or emesis, no hematuria.    Past Medical History  Diagnosis Date  . ADHD (attention deficit hyperactivity disorder)   . Bipolar 1 disorder   . Manic-depressive   . Polysubstance abuse     cocaine, opiates, benzos, marijuana  . Trichomonal vaginitis    Past Surgical History  Procedure Laterality Date  . Dilation and curettage of uterus     Family History  Problem Relation Age of Onset  . COPD Mother    History  Substance Use Topics  . Smoking status: Current Every Day Smoker -- 1.00 packs/day for 12 years    Types: Cigarettes  . Smokeless tobacco: Never Used  . Alcohol Use: No   OB History   Grav Para Term Preterm Abortions TAB SAB Ect Mult Living   6 3 3  2  2         Review of Systems ROS: Statement: All systems negative except as marked or noted in the HPI; Constitutional: Negative for fever and chills. ; ; Eyes: Negative for eye pain, redness and discharge. ; ; ENMT: Negative for ear pain, hoarseness, nasal congestion, sinus pressure and sore throat. ; ;  Cardiovascular: Negative for chest pain, palpitations, diaphoresis, dyspnea and peripheral edema. ; ; Respiratory: Negative for cough, wheezing and stridor. ; ; Gastrointestinal: Negative for nausea, vomiting, diarrhea, abdominal pain, blood in stool, hematemesis, jaundice and rectal bleeding. . ; ; Genitourinary: +flank pain, dysuria. Negative for hematuria. ; ; GYN:  No vaginal bleeding, no vaginal discharge, no vulvar pain.;;  Musculoskeletal: Negative for back pain and neck pain. Negative for swelling and trauma.; ; Skin: Negative for pruritus, rash, abrasions, blisters, bruising and skin lesion.; ; Neuro: Negative for headache, lightheadedness and neck stiffness. Negative for weakness, altered level of consciousness , altered mental status, extremity weakness, paresthesias, involuntary movement, seizure and syncope.      Allergies  Review of patient's allergies indicates no known allergies.  Home Medications   Current Outpatient Rx  Name  Route  Sig  Dispense  Refill  . aspirin EC 81 MG tablet   Oral   Take 324 mg by mouth once as needed for mild pain or moderate pain.          BP 111/62  Pulse 112  Temp(Src) 97.9 F (36.6 C) (Oral)  Resp 17  Ht 5\' 5"  (1.651 m)  Wt 149 lb (67.586 kg)  BMI 24.79 kg/m2  SpO2 97%  LMP  09/03/2012 Physical Exam 1535: Physical examination:  Nursing notes reviewed; Vital signs and O2 SAT reviewed;  Constitutional: Well developed, Well nourished, Well hydrated, In no acute distress; Head:  Normocephalic, atraumatic; Eyes: EOMI, PERRL, No scleral icterus; ENMT: Mouth and pharynx normal, Mucous membranes moist; Neck: Supple, Full range of motion, No lymphadenopathy; Cardiovascular: Regular rate and rhythm, No murmur, rub, or gallop; Respiratory: Breath sounds clear & equal bilaterally, No rales, rhonchi, wheezes.  Speaking full sentences with ease, Normal respiratory effort/excursion; Chest: Nontender, Movement normal; Abdomen: Soft, +LUQ and LLQ tenderness  to palp. No rebound or guarding. Nondistended, Normal bowel sounds; Genitourinary: +left CVA tenderness. Pelvic exam performed with permission of pt and female ED tech assist during exam.  External genitalia w/o lesions. Vaginal vault with thick white discharge.  Cervix w/o lesions, not friable, os closed, no bleeding. GC/chlam and wet prep obtained and sent to lab.  Bimanual exam w/o CMT, uterine or adnexal tenderness.;;; Spine:  No midline CS, TS, LS tenderness. +TTP left lumbar paraspinal muscles. No rash;; Extremities: Pulses normal, No tenderness, No edema, No calf edema or asymmetry.; Neuro: AA&Ox3, Major CN grossly intact.  Speech clear. No gross focal motor or sensory deficits in extremities.; Skin: Color normal, Warm, Dry.   ED Course  Procedures     EKG Interpretation None      MDM  MDM Reviewed: previous chart, nursing note and vitals Reviewed previous: labs Interpretation: labs and ultrasound    Results for orders placed during the hospital encounter of 10/03/13  WET PREP, GENITAL      Result Value Ref Range   Yeast Wet Prep HPF POC NONE SEEN  NONE SEEN   Trich, Wet Prep FEW (*) NONE SEEN   Clue Cells Wet Prep HPF POC FEW (*) NONE SEEN   WBC, Wet Prep HPF POC MANY (*) NONE SEEN  URINALYSIS, ROUTINE W REFLEX MICROSCOPIC      Result Value Ref Range   Color, Urine YELLOW  YELLOW   APPearance HAZY (*) CLEAR   Specific Gravity, Urine 1.020  1.005 - 1.030   pH 5.5  5.0 - 8.0   Glucose, UA NEGATIVE  NEGATIVE mg/dL   Hgb urine dipstick SMALL (*) NEGATIVE   Bilirubin Urine NEGATIVE  NEGATIVE   Ketones, ur NEGATIVE  NEGATIVE mg/dL   Protein, ur TRACE (*) NEGATIVE mg/dL   Urobilinogen, UA 0.2  0.0 - 1.0 mg/dL   Nitrite POSITIVE (*) NEGATIVE   Leukocytes, UA LARGE (*) NEGATIVE  PREGNANCY, URINE      Result Value Ref Range   Preg Test, Ur POSITIVE (*) NEGATIVE  CBC WITH DIFFERENTIAL      Result Value Ref Range   WBC 11.8 (*) 4.0 - 10.5 K/uL   RBC 3.82 (*) 3.87 - 5.11  MIL/uL   Hemoglobin 12.1  12.0 - 15.0 g/dL   HCT 16.1 (*) 09.6 - 04.5 %   MCV 92.9  78.0 - 100.0 fL   MCH 31.7  26.0 - 34.0 pg   MCHC 34.1  30.0 - 36.0 g/dL   RDW 40.9  81.1 - 91.4 %   Platelets 201  150 - 400 K/uL   Neutrophils Relative % 79 (*) 43 - 77 %   Neutro Abs 9.3 (*) 1.7 - 7.7 K/uL   Lymphocytes Relative 11 (*) 12 - 46 %   Lymphs Abs 1.2  0.7 - 4.0 K/uL   Monocytes Relative 10  3 - 12 %   Monocytes Absolute 1.2 (*) 0.1 - 1.0  K/uL   Eosinophils Relative 1  0 - 5 %   Eosinophils Absolute 0.1  0.0 - 0.7 K/uL   Basophils Relative 0  0 - 1 %   Basophils Absolute 0.0  0.0 - 0.1 K/uL  COMPREHENSIVE METABOLIC PANEL      Result Value Ref Range   Sodium 135 (*) 137 - 147 mEq/L   Potassium 3.4 (*) 3.7 - 5.3 mEq/L   Chloride 97  96 - 112 mEq/L   CO2 26  19 - 32 mEq/L   Glucose, Bld 75  70 - 99 mg/dL   BUN 4 (*) 6 - 23 mg/dL   Creatinine, Ser 1.47  0.50 - 1.10 mg/dL   Calcium 8.8  8.4 - 82.9 mg/dL   Total Protein 7.3  6.0 - 8.3 g/dL   Albumin 2.7 (*) 3.5 - 5.2 g/dL   AST 11  0 - 37 U/L   ALT 8  0 - 35 U/L   Alkaline Phosphatase 108  39 - 117 U/L   Total Bilirubin 0.2 (*) 0.3 - 1.2 mg/dL   GFR calc non Af Amer >90  >90 mL/min   GFR calc Af Amer >90  >90 mL/min  LIPASE, BLOOD      Result Value Ref Range   Lipase 26  11 - 59 U/L  URINE MICROSCOPIC-ADD ON      Result Value Ref Range   Squamous Epithelial / LPF FEW (*) RARE   WBC, UA TOO NUMEROUS TO COUNT  <3 WBC/hpf   RBC / HPF 3-6  <3 RBC/hpf   Bacteria, UA MANY (*) RARE   Urine-Other TRICHOMONAS PRESENT    HCG, QUANTITATIVE, PREGNANCY      Result Value Ref Range   hCG, Beta Chain, Quant, S 9167 (*) <5 mIU/mL  ABO/RH      Result Value Ref Range   ABO/RH(D) B POS     US Ob Limited 10/03/2013   CLINICAL DATA:  Pelvic pain.  Positive pregnancy test.  EXAM: LIMITED OBSTETRIC ULTRASOUND  FINDINGS: Number of Fetuses: 1  Heart Rate:  163 bpm  Movement: Yes  Presentation: Cephalic  Placental Location: Anterior to the right   Previa: No  Amniotic Fluid (Subjective):  Within normal limits.  BPD:  61.7cm 25w  0d  MATERNAL FINDINGS:  Cervix:  Closed.  Approximately 4 cm in length.  Uterus/Adnexae:  No abnormality visualized.  IMPRESSION: Single living intrauterine gestation in cephalic recent a shin. Estimated gestational age by BPD on this limited study is 25 weeks 0 days.  This exam is performed on an emergent basis and does not comprehensively evaluate fetal size, dating, or anatomy; follow-up complete OB US should be considered if further fetal assessment is warranted.   Electronically Signed   By: Kennith Center M.D.   On: 10/03/2013 19:20    1930:  Will dose IV rocephin for pyelonephritis. Will tx with flagyl for trichomonas. GC/chlam pending. No N/V/D while in the ED.  Pt is high risk due to no prenatal care and pt endorses continued polysubstance abuse throughout this pregnancy; will admit. Dx and testing d/w pt.  Questions answered.  Verb understanding, agreeable to transfer to [facility] for admit.  T/C to OB/GYN Dr. Emelda Fear, case discussed, including:  HPI, pertinent PM/SHx, VS/PE, dx testing, ED course and treatment:  Agreeable to transfer to [facility] for admit, requests he will come to ED for eval.    Laray Anger, DO 10/05/13 1336

## 2013-10-03 NOTE — H&P (Signed)
FACULTY PRACTICE ANTEPARTUM ADMISSION HISTORY AND PHYSICAL NOTE   History of Present Illness: Penny Hale is a 28 y.o. [phone] at Unknown admitted for pyelonephritis, at 27 weeks.  She has had foul smellling urine x a week, left flank tenderness x less than a week, and now presents to [facility] where she is found to be afebrile, but reproducible left CVAT, and grossly foul urine.   She is homeless, living with a female "friend" in [place], as the only place she knows that she can go..(Not the FOB for this pregnancy) She has recently realized she's pregnant. She has been using cocaine daily, also using Xanax, and Marijuana, and alleges that she has been begging [facility], Michell Heinrich x weeks to find her a place to go to get off her drugs. She did get seen [facility] ED 2/13 for med Screening exam, but preg test not done.  She was briefly a resident at [facility] resident pregnacy rehab program with her last pregnancy, 2 yrs ago, but discontinued their care; she would be very interested in referral again , if accepted. Patient reports the fetal movement as active. Patient reports uterine contraction  activity as none. Patient reports  vaginal bleeding as none. Patient describes fluid per vagina as None. Fetal presentation is unsure.  There are no active problems to display for this patient.   Past Medical History  Diagnosis Date  . ADHD (attention deficit hyperactivity disorder)   . Bipolar 1 disorder   . Manic-depressive   . Polysubstance abuse     cocaine, opiates, benzos, marijuana    Past Surgical History  Procedure Laterality Date  . Dilation and curettage of uterus      OB History  Gravida Para Term Preterm AB SAB TAB Ectopic Multiple Living  6 3 3  2 2         # Outcome Date GA Lbr Len/2nd Weight Sex Delivery Anes PTL Lv  6 SAB           5 SAB           4 TRM           3 TRM           2 TRM           1 GRA              Comments: System Generated. Please review and  update pregnancy details.    She does not have custody of the children, the 49 yr old is with its FOB, the 29 yr old with a relative of that FOB,who is incarcerated. She reports she has not surrendered rights to children.  History   Social History  . Marital Status: Single    Spouse Name: N/A    Number of Children: N/A  . Years of Education: N/A   Social History Main Topics  . Smoking status: Current Every Day Smoker -- 1.00 packs/day for 12 years    Types: Cigarettes  . Smokeless tobacco: Never Used  . Alcohol Use: No  . Drug Use: Yes    Special: Cocaine, Marijuana     Comment: last used 3 weeks ago  . Sexual Activity: Yes    Birth Control/ Protection: Condom   Other Topics Concern  . None   Social History Narrative  . None    Family History  Problem Relation Age of Onset  . COPD Mother     No Known Allergies   (Not in a hospital  admission)  Review of Systems - Negative except extensive drug use hx. Denies needle drugs.Denies partners that use IV drugs.  Vitals:  BP 111/62  Pulse 112  Temp(Src) 97.9 F (36.6 C) (Oral)  Resp 17  Ht 5\' 5"  (1.651 m)  Wt 149 lb (67.586 kg)  BMI 24.79 kg/m2  SpO2 97%  LMP 09/03/2012 Physical Examination: GENERAL: Well-developed, well-nourished female in no acute distress.Appears much older than stated age  LUNGS: Clear to auscultation bilaterally.  HEART: Regular rate and rhythm. ABDOMEN: Soft, nontender, nondistended. No organomegaly.Gravid Uterus c/w dates Cervix long, closed on U/s GC/CHL collected. Pap not done EXTREMITIES: No cyanosis, clubbing, or edema, 2+ distal pulses with DTRs 2+ bilaterally Membranes:intact Fetal Monitoring:normal fht with u/s Tocometer: Flat  Labs:  Results for orders placed during the hospital encounter of 10/03/13 (from the past 24 hour(s))  URINALYSIS, ROUTINE W REFLEX MICROSCOPIC   Collection Time    10/03/13  4:33 PM      Result Value Ref Range   Color, Urine YELLOW  YELLOW   APPearance  HAZY (*) CLEAR   Specific Gravity, Urine 1.020  1.005 - 1.030   pH 5.5  5.0 - 8.0   Glucose, UA NEGATIVE  NEGATIVE mg/dL   Hgb urine dipstick SMALL (*) NEGATIVE   Bilirubin Urine NEGATIVE  NEGATIVE   Ketones, ur NEGATIVE  NEGATIVE mg/dL   Protein, ur TRACE (*) NEGATIVE mg/dL   Urobilinogen, UA 0.2  0.0 - 1.0 mg/dL   Nitrite POSITIVE (*) NEGATIVE   Leukocytes, UA LARGE (*) NEGATIVE  PREGNANCY, URINE   Collection Time    10/03/13  4:33 PM      Result Value Ref Range   Preg Test, Ur POSITIVE (*) NEGATIVE  URINE MICROSCOPIC-ADD ON   Collection Time    10/03/13  4:33 PM      Result Value Ref Range   Squamous Epithelial / LPF FEW (*) RARE   WBC, UA TOO NUMEROUS TO COUNT  <3 WBC/hpf   RBC / HPF 3-6  <3 RBC/hpf   Bacteria, UA MANY (*) RARE   Urine-Other TRICHOMONAS PRESENT    CBC WITH DIFFERENTIAL   Collection Time    10/03/13  4:50 PM      Result Value Ref Range   WBC 11.8 (*) 4.0 - 10.5 K/uL   RBC 3.82 (*) 3.87 - 5.11 MIL/uL   Hemoglobin 12.1  12.0 - 15.0 g/dL   HCT 82.935.5 (*) 56.236.0 - 13.046.0 %   MCV 92.9  78.0 - 100.0 fL   MCH 31.7  26.0 - 34.0 pg   MCHC 34.1  30.0 - 36.0 g/dL   RDW 86.512.7  78.411.5 - 69.615.5 %   Platelets 201  150 - 400 K/uL   Neutrophils Relative % 79 (*) 43 - 77 %   Neutro Abs 9.3 (*) 1.7 - 7.7 K/uL   Lymphocytes Relative 11 (*) 12 - 46 %   Lymphs Abs 1.2  0.7 - 4.0 K/uL   Monocytes Relative 10  3 - 12 %   Monocytes Absolute 1.2 (*) 0.1 - 1.0 K/uL   Eosinophils Relative 1  0 - 5 %   Eosinophils Absolute 0.1  0.0 - 0.7 K/uL   Basophils Relative 0  0 - 1 %   Basophils Absolute 0.0  0.0 - 0.1 K/uL  COMPREHENSIVE METABOLIC PANEL   Collection Time    10/03/13  4:50 PM      Result Value Ref Range   Sodium 135 (*) 137 -  147 mEq/L   Potassium 3.4 (*) 3.7 - 5.3 mEq/L   Chloride 97  96 - 112 mEq/L   CO2 26  19 - 32 mEq/L   Glucose, Bld 75  70 - 99 mg/dL   BUN 4 (*) 6 - 23 mg/dL   Creatinine, Ser 2.13  0.50 - 1.10 mg/dL   Calcium 8.8  8.4 - 08.6 mg/dL   Total Protein  7.3  6.0 - 8.3 g/dL   Albumin 2.7 (*) 3.5 - 5.2 g/dL   AST 11  0 - 37 U/L   ALT 8  0 - 35 U/L   Alkaline Phosphatase 108  39 - 117 U/L   Total Bilirubin 0.2 (*) 0.3 - 1.2 mg/dL   GFR calc non Af Amer >90  >90 mL/min   GFR calc Af Amer >90  >90 mL/min  LIPASE, BLOOD   Collection Time    10/03/13  4:50 PM      Result Value Ref Range   Lipase 26  11 - 59 U/L  HCG, QUANTITATIVE, PREGNANCY   Collection Time    10/03/13  4:50 PM      Result Value Ref Range   hCG, Beta Chain, Quant, S 9167 (*) <5 mIU/mL  ABO/RH   Collection Time    10/03/13  5:41 PM      Result Value Ref Range   ABO/RH(D) B POS    WET PREP, GENITAL   Collection Time    10/03/13  7:16 PM      Result Value Ref Range   Yeast Wet Prep HPF POC NONE SEEN  NONE SEEN   Trich, Wet Prep FEW (*) NONE SEEN   Clue Cells Wet Prep HPF POC FEW (*) NONE SEEN   WBC, Wet Prep HPF POC MANY (*) NONE SEEN    Imaging Studies: US Ob Limited  10/03/2013   CLINICAL DATA:  Pelvic pain.  Positive pregnancy test.  EXAM: LIMITED OBSTETRIC ULTRASOUND  FINDINGS: Number of Fetuses: 1  Heart Rate:  163 bpm  Movement: Yes  Presentation: Cephalic  Placental Location: Anterior to the right  Previa: No  Amniotic Fluid (Subjective):  Within normal limits.  BPD:  61.7cm 25w  0d  MATERNAL FINDINGS:  Cervix:  Closed.  Approximately 4 cm in length.  Uterus/Adnexae:  No abnormality visualized.  IMPRESSION: Single living intrauterine gestation in cephalic recent a shin. Estimated gestational age by BPD on this limited study is 25 weeks 0 days.  This exam is performed on an emergent basis and does not comprehensively evaluate fetal size, dating, or anatomy; follow-up complete OB US should be considered if further fetal assessment is warranted.   Electronically Signed   By: Kennith Center M.D.   On: 10/03/2013 19:20     Assessment and Plan: 1. Early Pyelonephritis 2. No prenatal care 3. Polysubstance abuse with Cocaine, THC, Benzodiazepines. 4.  Homeless  Admit, IV rocephin, Since she is above 20 wk, admit at [facility] not an option Social work consult Anticipate brief stay, depending on social work / referral to Drug Rehab center, ? [facility]? Admit discussed with Nicholaus Bloom Attending at [facility]

## 2013-10-04 ENCOUNTER — Inpatient Hospital Stay (HOSPITAL_COMMUNITY): Payer: Medicaid Other

## 2013-10-04 ENCOUNTER — Encounter (HOSPITAL_COMMUNITY): Payer: Self-pay | Admitting: *Deleted

## 2013-10-04 LAB — HEPATITIS B SURFACE ANTIGEN
HEP B S AG: NEGATIVE
Hepatitis B Surface Ag: NEGATIVE

## 2013-10-04 LAB — HEPATITIS C ANTIBODY: HCV Ab: NEGATIVE

## 2013-10-04 LAB — RPR
RPR Ser Ql: NONREACTIVE
RPR Ser Ql: NONREACTIVE

## 2013-10-04 LAB — RAPID HIV SCREEN (WH-MAU): Rapid HIV Screen: NONREACTIVE

## 2013-10-04 MED ORDER — DEXTROSE 5 % IV SOLN
1.0000 g | Freq: Two times a day (BID) | INTRAVENOUS | Status: DC
  Administered 2013-10-04 – 2013-10-06 (×5): 1 g via INTRAVENOUS
  Filled 2013-10-04 (×5): qty 10

## 2013-10-04 MED ORDER — FAMOTIDINE 20 MG PO TABS
20.0000 mg | ORAL_TABLET | Freq: Two times a day (BID) | ORAL | Status: DC
  Administered 2013-10-04 – 2013-10-06 (×5): 20 mg via ORAL
  Filled 2013-10-04 (×5): qty 1

## 2013-10-04 MED ORDER — IBUPROFEN 100 MG/5ML PO SUSP
600.0000 mg | Freq: Four times a day (QID) | ORAL | Status: DC | PRN
  Filled 2013-10-04: qty 30

## 2013-10-04 MED ORDER — CEFTRIAXONE SODIUM 2 G IJ SOLR
2.0000 g | Freq: Two times a day (BID) | INTRAMUSCULAR | Status: DC
Start: 2013-10-04 — End: 2013-10-04

## 2013-10-04 MED ORDER — IBUPROFEN 600 MG PO TABS
600.0000 mg | ORAL_TABLET | Freq: Four times a day (QID) | ORAL | Status: DC | PRN
  Administered 2013-10-05 – 2013-10-06 (×4): 600 mg via ORAL
  Filled 2013-10-04 (×4): qty 1

## 2013-10-04 MED ORDER — SODIUM CHLORIDE 0.9 % IV SOLN
INTRAVENOUS | Status: DC
  Administered 2013-10-04 – 2013-10-05 (×3): via INTRAVENOUS

## 2013-10-04 NOTE — Progress Notes (Signed)
Chaplain services with pt at present

## 2013-10-04 NOTE — Progress Notes (Signed)
UR completed 

## 2013-10-04 NOTE — Progress Notes (Signed)
Social worker with pt.

## 2013-10-04 NOTE — Progress Notes (Addendum)
Patient ID: Penny KennedyHeather C Sailer, female   DOB: Apr 21, 1986, 28 y.o.   MRN: [id number]  S. She is feeling somewhat better but still with significant Left Flank pain. She reports good FM and denies VB, ROM, or CTXs. She does not have custody of any of her children. A cousin plans to adopt this baby. She would like a PPS/BTL but she does not have Medicaid yet.  O. VSS, AF FHR- category 1    Heart-rrr    Lungs- CTAB    Abd- benign, gravid    Left flank pain noted, some rebound to left when pounding on right flank    U/S showed baby to be breech and measuring 23.[redacted] weeks EGA  A/P. Pyelonephritis at 23.[redacted] weeks EGA with drug abuse     Social work consult today Continue Rocephin Trich on urinalysis- she was given flagyl 2 gram po at [facility]

## 2013-10-04 NOTE — Progress Notes (Signed)
Pt out of room.  Pt going outside for 15-20 minutes

## 2013-10-04 NOTE — Progress Notes (Signed)
CSW assessment completed.  Full documentation to follow.  Patient requests inpatient substance abuse treatment and understands that this is not usually immediate.  CSW will explore her options and let her know if there is anything available at this time.  CSW explained that patient cannot remain in the hospital unless she has a medical reason, and that waiting on a treatment bed is not a medical reason.  Therefore, if a bed has not been secured prior to the time patient is medically ready to discharge, CSW will follow up with patient by phone.  She states understanding.

## 2013-10-04 NOTE — Progress Notes (Signed)
Pt did not go outside at 1813.  Pt outside now

## 2013-10-04 NOTE — Progress Notes (Signed)
Pt up on walk, outside
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
## 2013-10-04 NOTE — Progress Notes (Signed)
Chaplain received referral from [employer] Lisa Lundeen. Presented to patient, offering emotional and spiritual support. Patient said she "doesn't like to be sad or depressed," so she gets high to escape from those emotions. She told chaplain about two children living with their father whom she is able to visit. She said "there is nothing happy in my life" and said she regrets "being a bad mother, which is another reason for getting high." She doesn't like to talk about things because it makes her sad and she doesn't like to cry or be sad. Patient was tearful during pastoral conversation, ended conversation apologetically when her feelings became overwhelming. She is aware of chaplain services and availability. Will refer for follow up visit Wednesday.   Guy SandiferHillary D Calvinrusta, IowaChaplain [phone]

## 2013-10-05 DIAGNOSIS — O2390 Unspecified genitourinary tract infection in pregnancy, unspecified trimester: Principal | ICD-10-CM

## 2013-10-05 LAB — RAPID URINE DRUG SCREEN, HOSP PERFORMED
AMPHETAMINES: NOT DETECTED
Barbiturates: NOT DETECTED
Benzodiazepines: NOT DETECTED
COCAINE: NOT DETECTED
OPIATES: NOT DETECTED
Tetrahydrocannabinol: NOT DETECTED

## 2013-10-05 LAB — RUBELLA SCREEN
Rubella: 1.56 Index — ABNORMAL HIGH (ref <0.90)
Rubella: 1.34 Index — ABNORMAL HIGH (ref <0.90)

## 2013-10-05 NOTE — Progress Notes (Signed)
In review of pt labs, there is no urine culture pending. Discussed with [facility] at [facility], where initial specimen remains available. [facility].m[facility] For culture and sensitivity.

## 2013-10-05 NOTE — Progress Notes (Signed)
CSW provided patient with an application for [facility] to complete and asked her to leave anything blank that she has questions about and CSW will assist her with these sections.  CSW left a message for [facility].  [facility]W spoke with staff at [facility] and is waiting for an application to be emailed to CSW, however, it has not come through yet.  CSW will call back in the morning to check on it.  These are the only three centers in the state who have open beds at this time.  Patient appears to be in great spirits today.  She is appreciative of CSW's interventions.

## 2013-10-05 NOTE — Progress Notes (Signed)
Patient ID: Penny KennedyHeather C Alguire, female   DOB: 1986-03-19, 28 y.o.   MRN: [id number] FACULTY PRACTICE ANTEPARTUM(COMPREHENSIVE) NOTE  Penny KennedyHeather C Cabreja is a 28 y.o. [id number] at 3926w3d  who is admitted for pyelonephritis.   Length of Stay:  2  Days  Subjective: Patient reports the fetal movement as active. Patient reports uterine contraction  activity as none. Patient reports  vaginal bleeding as none. Patient describes fluid per vagina as None.  Vitals:  Blood pressure 108/60, pulse 75, temperature 98.8 F (37.1 C), temperature source Oral, resp. rate 18, height 5\' 5"  (1.651 m), weight 151 lb 8 oz (68.72 kg), SpO2 97.00%, unknown if currently breastfeeding. Physical Examination:  General appearance - alert, well appearing, and in no distress Abdomen - soft, non tender, gravid Extremities - no edema, redness or tenderness in the calves or thighs Back - left CVA tenderness  Fetal Monitoring:  Reassuring FHT for [redacted] week gestation.  Labs:  Results for orders placed during the hospital encounter of 10/03/13 (from the past 24 hour(s))  URINE RAPID DRUG SCREEN (HOSP PERFORMED)   Collection Time    10/04/13 10:50 PM      Result Value Ref Range   Opiates NONE DETECTED  NONE DETECTED   Cocaine NONE DETECTED  NONE DETECTED   Benzodiazepines NONE DETECTED  NONE DETECTED   Amphetamines NONE DETECTED  NONE DETECTED   Tetrahydrocannabinol NONE DETECTED  NONE DETECTED   Barbiturates NONE DETECTED  NONE DETECTED    Medications:  Scheduled . cefTRIAXone (ROCEPHIN)  IV  1 g Intravenous Q12H  . docusate sodium  100 mg Oral BID  . famotidine  20 mg Oral BID  . prenatal multivitamin  1 tablet Oral Q1200   I have reviewed the patient's current medications.  ASSESSMENT: Patient Active Problem List   Diagnosis Date Noted  . Pyelonephritis complicating pregnancy in second trimester 10/03/2013  . Cocaine abuse complicating pregnancy in second trimester 10/03/2013  . Benzodiazepine abuse 10/03/2013  .  Marijuana use 10/03/2013  . Opiate use 10/03/2013  . Trichomoniasis 10/03/2013  . No prenatal care in current pregnancy 10/03/2013  . Homeless single person 10/03/2013    PLAN: 28 yo G7P4024 at 23 weeks and 3 days with pyelonephritis improving in Ceftriaxone. Continue IV antibiotics. D/C when pain free Ensure pt has meds at discharge.  Jenniferann Stuckert H. 10/05/2013,8:24 AM

## 2013-10-06 LAB — GC/CHLAMYDIA PROBE AMP
CT PROBE, AMP APTIMA: NEGATIVE
GC Probe RNA: NEGATIVE

## 2013-10-06 MED ORDER — PRENATAL MULTIVITAMIN CH: 1.0000 | ORAL_TABLET | Freq: Every day | ORAL | Status: DC

## 2013-10-06 MED ORDER — CEPHALEXIN 500 MG PO CAPS: 500.0000 mg | ORAL_CAPSULE | ORAL | Status: DC

## 2013-10-06 MED ORDER — CEPHALEXIN 500 MG PO CAPS
500.0000 mg | ORAL_CAPSULE | Freq: Four times a day (QID) | ORAL | Status: DC
  Administered 2013-10-06 (×2): 500 mg via ORAL
  Filled 2013-10-06 (×2): qty 1

## 2013-10-06 NOTE — Progress Notes (Signed)
Pt. Back in room

## 2013-10-06 NOTE — Progress Notes (Signed)
SW and CM called and notified of poss dc plans today Pt has found a ride home

## 2013-10-06 NOTE — Care Management Note (Signed)
    Page 1 of 1   10/06/2013     12:05:11 PM   CARE MANAGEMENT NOTE 10/06/2013  Patient:  Penny Hale, Penny Hale   Account Number:  [id number]  Date Initiated:  10/06/2013  Documentation initiated by:  CRAFT,TERRI  Subjective/Objective Assessment:   28 year old female admitted 10/03/13 with flank pain-pyelonephritis     Action/Plan:   D/C when medically stable   Anticipated DC Date:  10/06/2013   Anticipated DC Plan:  HOME/SELF CARE  In-house referral  Clinical Social Worker      [place]  CM consult  [place] Program  Medication Assistance            Status of service:  Completed, signed off  Discharge Disposition:  HOME/SELF CARE   Comments:  10/06/13, Aida Raider RNC-MNN, BSN, [phone], CM received referral for medication assistance/MATCH program.  CM met with pt and discussed MATCH program.  Pt states she does not have the money for the co pay of $3.  Pt given [place] letter with instructions and all questions answered. Overide done for pt since she states she cannot pay $3 for prescription.

## 2013-10-06 NOTE — Discharge Instructions (Signed)

## 2013-10-06 NOTE — Progress Notes (Signed)
Pt. Is stable and ready to be discharge home. All belongings are with the patient. All discharge instructions given and prescriptions reviewed. Prescription given to patient. Patient verbalizes she will call for a follow-up appointment. Pt. Walked out of hospital with her friend to drive [place] back to [place]. She reports Colleen with CSW will call her and follow-up with placement.

## 2013-10-06 NOTE — Progress Notes (Signed)
Pt walking with friend outside  CM and SW has seen pt

## 2013-10-06 NOTE — Discharge Summary (Signed)
Attestation of Attending Supervision of Fellow: Evaluation and management procedures were performed by the Fellow under my supervision and collaboration.  I have reviewed the Fellow's note and chart, and I agree with the management and plan.    

## 2013-10-06 NOTE — Progress Notes (Signed)
CSW acknowledges second consult to social work, however, CSW is already working with patient to try to get her in to an inpatient substance abuse treatment facility.

## 2013-10-06 NOTE — Discharge Summary (Signed)
Physician Discharge Summary   Patient ID: Penny Hale [id number] 27 y.o. 1986/05/11  Admit date: 10/03/2013  Discharge date and time: 1:21 PM 2April15   Admitting Physician: Allie BossierMyra C Dove, MD   Discharge Physician: Penny Hale, Penny Hale  Admission Diagnoses: Bacterial vaginosis [616.10, 041.9] Intrauterine pregnancy [V22.2] Acute pyelonephritis in second trimester, antepartum [646.63, 590.10] Trichomonal vaginitis in pregnancy in second trimester [646.63, 131.01] No prenatal care in current pregnancy [V23.7]  Discharge Diagnoses: Same as above  Admission Condition: good  Discharged Condition: good  Indication for Admission: Pyelonephritis  Hospital Course: [facility] and diagnosed with pregnancy and pyelonephritis. Pt was also treated for BV and Trich. Pt tx with ceftriaxone initially and transitioned to keflex once afebrile. Pt is homeless and has been meeting with social worker for placement and has applications out. Pt also with hx of drug abuse and is attempting to enter into a rehab program. Pt will be discharged with friend and SW to follow up with placement.  Ucx + for ecoli, pending sensitivities but clinical improvement with cephalosporin  Consults: Social Workl  Significant Diagnostic Studies: labs: E.coli in urine  Treatments: IV hydration and antibiotics: ceftriaxone and switched to keflex  Discharge Exam: BP 113/65  Pulse 98  Temp(Src) 98.2 F (36.8 C) (Oral)  Resp 18  Ht 5\' 5"  (1.651 m)  Wt 68.72 kg (151 lb 8 oz)  BMI 25.21 kg/m2  SpO2 97%  Breastfeeding? Unknown  General Appearance:    Alert, cooperative, no distress, appears stated age  Head:    Normocephalic, without obvious abnormality, atraumatic  Back:     Symmetric, no curvature, ROM normal, no CVA tenderness  Lungs:     Clear to auscultation bilaterally, respirations unlabored      Heart:    Regular rate and rhythm, S1 and S2 normal, no murmur, rub   or gallop     Abdomen:      Soft, gravid, non-tender, no CVAT   no masses, no organomegaly  Extremities:   Extremities normal, atraumatic, no cyanosis or edema     Skin:   Skin color, texture, turgor normal, no rashes or lesions          Disposition: 01-Home or Self Care  Patient Instructions:    Medication List         aspirin EC 81 MG tablet  Take 324 mg by mouth once as needed for mild pain or moderate pain.     cephALEXin 500 MG capsule  Commonly known as:  KEFLEX  Take 1 capsule (500 mg total) by mouth See admin instructions.     prenatal multivitamin Tabs tablet  Take 1 tablet by mouth daily at 12 noon.       Activity: activity as tolerated Diet: regular diet Wound Care: none needed  Follow-up with Primary OB in several weeks.  SignedTawana Scale: Penny Hale 10/06/2013 1:21 PM

## 2013-10-07 LAB — URINE CULTURE: Colony Count: >=100000

## 2013-10-07 NOTE — Progress Notes (Signed)
CSW [facility] and will wait to see if patient will be accepted.

## 2013-10-08 ENCOUNTER — Encounter: Payer: Self-pay | Admitting: Obstetrics and Gynecology

## 2013-10-13 NOTE — Progress Notes (Signed)
CSW received a [facility] [place] [facility] stating she received the referral and was following up.  CSW attempted to contact patient to request that she call Jasmine DecemberSharon, but patient's cell phone gave a message that the mailbox was full and not accepting new messages.  CSW ensured that Jasmine DecemberSharon has patient's phone number and we will try again to contact patient.

## 2013-10-18 NOTE — Progress Notes (Signed)
CSW has attempted to contact patient by phone multiple times to discuss admission to inpatient substance abuse treatment, but is unable to reach patient.

## 2013-10-20 ENCOUNTER — Encounter: Payer: Self-pay | Admitting: Family

## 2013-11-01 ENCOUNTER — Other Ambulatory Visit (HOSPITAL_COMMUNITY)
Admission: RE | Admit: 2013-11-01 | Discharge: 2013-11-01 | Disposition: A | Discharge status: Home / Self Care | Payer: Medicaid Other | Source: Ambulatory Visit | Attending: Obstetrics & Gynecology | Admitting: Obstetrics & Gynecology

## 2013-11-01 ENCOUNTER — Ambulatory Visit (INDEPENDENT_AMBULATORY_CARE_PROVIDER_SITE_OTHER): Payer: Medicaid Other | Admitting: Obstetrics & Gynecology

## 2013-11-01 ENCOUNTER — Encounter: Payer: Self-pay | Admitting: Obstetrics & Gynecology

## 2013-11-01 VITALS — BP 131/71 | HR 106 | Wt 162.0 lb

## 2013-11-01 DIAGNOSIS — Z124 Encounter for screening for malignant neoplasm of cervix: Secondary | ICD-10-CM

## 2013-11-01 DIAGNOSIS — Z113 Encounter for screening for infections with a predominantly sexual mode of transmission: Secondary | ICD-10-CM | POA: Insufficient documentation

## 2013-11-01 DIAGNOSIS — N760 Acute vaginitis: Secondary | ICD-10-CM | POA: Insufficient documentation

## 2013-11-01 DIAGNOSIS — O99320 Drug use complicating pregnancy, unspecified trimester: Secondary | ICD-10-CM

## 2013-11-01 DIAGNOSIS — O0930 Supervision of pregnancy with insufficient antenatal care, unspecified trimester: Secondary | ICD-10-CM

## 2013-11-01 DIAGNOSIS — F1920 Other psychoactive substance dependence, uncomplicated: Secondary | ICD-10-CM

## 2013-11-01 DIAGNOSIS — O0933 Supervision of pregnancy with insufficient antenatal care, third trimester: Secondary | ICD-10-CM

## 2013-11-01 DIAGNOSIS — Z01419 Encounter for gynecological examination (general) (routine) without abnormal findings: Secondary | ICD-10-CM | POA: Insufficient documentation

## 2013-11-01 DIAGNOSIS — Z331 Pregnant state, incidental: Secondary | ICD-10-CM

## 2013-11-01 DIAGNOSIS — Z1151 Encounter for screening for human papillomavirus (HPV): Secondary | ICD-10-CM

## 2013-11-01 NOTE — Progress Notes (Signed)
Late prenatal care, recent hosp for pyelonephritis, now on Keflex daily. H/O substance abuse, trying to get inpatient treatment. Considering adoption  Subjective:    Penny KennedyHeather C Cullinan is a [id number] 5060w2d being seen today for her first obstetrical visit.  Her obstetrical history is significant for late care, pyelonephritis, substance abuse. Patient does not intend to breast feed. Pregnancy history fully reviewed.  Patient reports no bleeding.  Filed Vitals:   11/01/13 1018  BP: 131/71  Pulse: 106  Weight: 162 lb (73.483 kg)    HISTORY: OB History  Gravida Para Term Preterm AB SAB TAB Ectopic Multiple Living  7 4 4  2 2    4     # Outcome Date GA Lbr Len/2nd Weight Sex Delivery Anes PTL Lv  7 CUR           6 SAB 08/04/11 7343w0d            Comments: no arms and legs  5 TRM 03/08/11 3329w0d  6 lb 6 oz (2.892 kg) M SVD None  Y  4 TRM 03/17/09 3929w0d  6 lb 5 oz (2.863 kg) F SVD EPI  Y  3 TRM 12/11/05 3470w0d  6 lb 3 oz (2.807 kg) F SVD   Y  2 SAB 11/02/03 4361w0d         1 TRM 04/01/01 3541w0d  8 lb 5 oz (3.771 kg) F SVD EPI  Y     Past Medical History  Diagnosis Date  . ADHD (attention deficit hyperactivity disorder)   . Bipolar 1 disorder   . Manic-depressive   . Polysubstance abuse     cocaine, opiates, benzos, marijuana  . Trichomonal vaginitis    Past Surgical History  Procedure Laterality Date  . Dilation and curettage of uterus     Family History  Problem Relation Age of Onset  . COPD Mother      Exam    Uterus:     Pelvic Exam:    Perineum: No Hemorrhoids   Vulva: normal   Vagina:  normal mucosa   pH:     Cervix: no lesions   Adnexa: not evaluated   Bony Pelvis: average  System:     Skin: normal coloration and turgor, no rashes    Neurologic: oriented, normal mood   Extremities: normal strength, tone, and muscle mass   HEENT oropharynx clear, no lesions   Mouth/Teeth mucous membranes moist, pharynx normal without lesions and dental hygiene good   Neck supple   Cardiovascular: regular rate and rhythm   Respiratory:  appears well, vitals normal, no respiratory distress, acyanotic, normal RR, neck free of mass or lymphadenopathy, chest clear, no wheezing, crepitations, rhonchi, normal symmetric air entry   Abdomen: soft, non-tender; bowel sounds normal; no masses,  no organomegaly   Urinary: urethral meatus normal      Assessment:    Pregnancy: [id number] Patient Active Problem List   Diagnosis Date Noted  . Pyelonephritis complicating pregnancy in second trimester 10/03/2013  . Cocaine abuse complicating pregnancy in second trimester 10/03/2013  . Benzodiazepine abuse 10/03/2013  . Marijuana use 10/03/2013  . Opiate use 10/03/2013  . Trichomoniasis 10/03/2013  . Late prenatal care complicating pregnancy in third trimester 10/03/2013  . Homeless single person 10/03/2013        Plan:     Initial labs drawn. Prenatal vitamins. Problem list reviewed and updated. Genetic Screening discussed too late.  Ultrasound discussed; fetal survey: results reviewed.  Follow up in 3 weeks. 50% of  30 min visit spent on counseling and coordination of care.   Social work involved   Adam PhenixJames G Arnold 11/01/2013

## 2013-11-01 NOTE — Patient Instructions (Signed)
Third Trimester of Pregnancy  The third trimester is from week 29 through week 42, months 7 through 9. The third trimester is a time when the fetus is growing rapidly. At the end of the ninth month, the fetus is about 20 inches in length and weighs 6 10 pounds.   BODY CHANGES  Your body goes through many changes during pregnancy. The changes vary from woman to woman.    Your weight will continue to increase. You can expect to gain 25 35 pounds (11 16 kg) by the end of the pregnancy.   You may begin to get stretch marks on your hips, abdomen, and breasts.   You may urinate more often because the fetus is moving lower into your pelvis and pressing on your bladder.   You may develop or continue to have heartburn as a result of your pregnancy.   You may develop constipation because certain hormones are causing the muscles that push waste through your intestines to slow down.   You may develop hemorrhoids or swollen, bulging veins (varicose veins).   You may have pelvic pain because of the weight gain and pregnancy hormones relaxing your joints between the bones in your pelvis. Back aches may result from over exertion of the muscles supporting your posture.   Your breasts will continue to grow and be tender. A yellow discharge may leak from your breasts called colostrum.   Your belly button may stick out.   You may feel short of breath because of your expanding uterus.   You may notice the fetus "dropping," or moving lower in your abdomen.   You may have a bloody mucus discharge. This usually occurs a few days to a week before labor begins.   Your cervix becomes thin and soft (effaced) near your due date.  WHAT TO EXPECT AT YOUR PRENATAL EXAMS   You will have prenatal exams every 2 weeks until week 36. Then, you will have weekly prenatal exams. During a routine prenatal visit:   You will be weighed to make sure you and the fetus are growing normally.   Your blood pressure is taken.   Your abdomen will be  measured to track your baby's growth.   The fetal heartbeat will be listened to.   Any test results from the previous visit will be discussed.   You may have a cervical check near your due date to see if you have effaced.  At around 36 weeks, your caregiver will check your cervix. At the same time, your caregiver will also perform a test on the secretions of the vaginal tissue. This test is to determine if a type of bacteria, Group B streptococcus, is present. Your caregiver will explain this further.  Your caregiver may ask you:   What your birth plan is.   How you are feeling.   If you are feeling the baby move.   If you have had any abnormal symptoms, such as leaking fluid, bleeding, severe headaches, or abdominal cramping.   If you have any questions.  Other tests or screenings that may be performed during your third trimester include:   Blood tests that check for low iron levels (anemia).   Fetal testing to check the health, activity level, and growth of the fetus. Testing is done if you have certain medical conditions or if there are problems during the pregnancy.  FALSE LABOR  You may feel small, irregular contractions that eventually go away. These are called Braxton Hicks contractions, or   false labor. Contractions may last for hours, days, or even weeks before true labor sets in. If contractions come at regular intervals, intensify, or become painful, it is best to be seen by your caregiver.   SIGNS OF LABOR    Menstrual-like cramps.   Contractions that are 5 minutes apart or less.   Contractions that start on the top of the uterus and spread down to the lower abdomen and back.   A sense of increased pelvic pressure or back pain.   A watery or bloody mucus discharge that comes from the vagina.  If you have any of these signs before the 37th week of pregnancy, call your caregiver right away. You need to go to the hospital to get checked immediately.  HOME CARE INSTRUCTIONS    Avoid all  smoking, herbs, alcohol, and unprescribed drugs. These chemicals affect the formation and growth of the baby.   Follow your caregiver's instructions regarding medicine use. There are medicines that are either safe or unsafe to take during pregnancy.   Exercise only as directed by your caregiver. Experiencing uterine cramps is a good sign to stop exercising.   Continue to eat regular, healthy meals.   Wear a good support bra for breast tenderness.   Do not use hot tubs, steam rooms, or saunas.   Wear your seat belt at all times when driving.   Avoid raw meat, uncooked cheese, cat litter boxes, and soil used by cats. These carry germs that can cause birth defects in the baby.   Take your prenatal vitamins.   Try taking a stool softener (if your caregiver approves) if you develop constipation. Eat more high-fiber foods, such as fresh vegetables or fruit and whole grains. Drink plenty of fluids to keep your urine clear or pale yellow.   Take warm sitz baths to soothe any pain or discomfort caused by hemorrhoids. Use hemorrhoid cream if your caregiver approves.   If you develop varicose veins, wear support hose. Elevate your feet for 15 minutes, 3 4 times a day. Limit salt in your diet.   Avoid heavy lifting, wear low heal shoes, and practice good posture.   Rest a lot with your legs elevated if you have leg cramps or low back pain.   Visit your dentist if you have not gone during your pregnancy. Use a soft toothbrush to brush your teeth and be gentle when you floss.   A sexual relationship may be continued unless your caregiver directs you otherwise.   Do not travel far distances unless it is absolutely necessary and only with the approval of your caregiver.   Take prenatal classes to understand, practice, and ask questions about the labor and delivery.   Make a trial run to the hospital.   Pack your hospital bag.   Prepare the baby's nursery.   Continue to go to all your prenatal visits as directed  by your caregiver.  SEEK MEDICAL CARE IF:   You are unsure if you are in labor or if your water has broken.   You have dizziness.   You have mild pelvic cramps, pelvic pressure, or nagging pain in your abdominal area.   You have persistent nausea, vomiting, or diarrhea.   You have a bad smelling vaginal discharge.   You have pain with urination.  SEEK IMMEDIATE MEDICAL CARE IF:    You have a fever.   You are leaking fluid from your vagina.   You have spotting or bleeding from your vagina.     You have severe abdominal cramping or pain.   You have rapid weight loss or gain.   You have shortness of breath with chest pain.   You notice sudden or extreme swelling of your face, hands, ankles, feet, or legs.   You have not felt your baby move in over an hour.   You have severe headaches that do not go away with medicine.   You have vision changes.  Document Released: 06/17/2001 Document Revised: 02/23/2013 Document Reviewed: 08/24/2012  ExitCare Patient Information 2014 [employer].

## 2013-11-02 LAB — OBSTETRIC PANEL
Antibody Screen: NEGATIVE
BASOS ABS: 0 10*3/uL (ref 0.0–0.1)
BASOS PCT: 0 % (ref 0–1)
EOS PCT: 2 % (ref 0–5)
Eosinophils Absolute: 0.2 10*3/uL (ref 0.0–0.7)
HCT: 35.3 % — ABNORMAL LOW (ref 36.0–46.0)
Hemoglobin: 12 g/dL (ref 12.0–15.0)
Hepatitis B Surface Ag: NEGATIVE
LYMPHS PCT: 22 % (ref 12–46)
Lymphs Abs: 1.9 10*3/uL (ref 0.7–4.0)
MCH: 30.3 pg (ref 26.0–34.0)
MCHC: 34 g/dL (ref 30.0–36.0)
MCV: 89.1 fL (ref 78.0–100.0)
Monocytes Absolute: 0.6 10*3/uL (ref 0.1–1.0)
Monocytes Relative: 7 % (ref 3–12)
Neutro Abs: 6.1 10*3/uL (ref 1.7–7.7)
Neutrophils Relative %: 69 % (ref 43–77)
Platelets: 212 10*3/uL (ref 150–400)
RBC: 3.96 MIL/uL (ref 3.87–5.11)
RDW: 13.6 % (ref 11.5–15.5)
Rh Type: POSITIVE
Rubella: 1.29 Index — ABNORMAL HIGH (ref <0.90)
WBC: 8.8 10*3/uL (ref 4.0–10.5)

## 2013-11-02 LAB — CBC
HCT: 35.3 % — ABNORMAL LOW (ref 36.0–46.0)
Hemoglobin: 12 g/dL (ref 12.0–15.0)
MCH: 30.3 pg (ref 26.0–34.0)
MCHC: 34 g/dL (ref 30.0–36.0)
MCV: 89.1 fL (ref 78.0–100.0)
PLATELETS: 212 10*3/uL (ref 150–400)
RBC: 3.96 MIL/uL (ref 3.87–5.11)
RDW: 13.6 % (ref 11.5–15.5)
WBC: 8.8 10*3/uL (ref 4.0–10.5)

## 2013-11-02 LAB — GLUCOSE TOLERANCE, 1 HOUR (50G) W/O FASTING: Glucose, 1 Hour GTT: 78 mg/dL (ref 70–140)

## 2013-11-02 LAB — HIV ANTIBODY (ROUTINE TESTING W REFLEX): HIV 1&2 Ab, 4th Generation: NONREACTIVE

## 2013-11-02 NOTE — Progress Notes (Signed)
Patient called CSW to inform CSW that she has spoken with Sharon/[facility] and will have a phone interview with her tomorrow at 9am.  She states she would like to take her son Gerilyn PilgrimJacob with her to treatment and assures CSW that she has custody of him.  CSW explained that CSW needs to verify this with CPS.  Patient agreed.  CSW will be working to assist with transportation if she is accepted into the program.  Patient is extremely appreciative for this opportunity.  [facility] to ensure that they are not involved and it was confirmed that patient has never had a case with CPS on her son Gerilyn PilgrimJacob.  CSW will speak to Social Work Director/Hope Rife to discuss transportation if patient is accepted.

## 2013-11-02 NOTE — Progress Notes (Signed)
CSW received call from patient (who had left a message for CSW yesterday while she was at her OB appointment) stating her phone is now working and that she is still interested in going to an inpatient substance abuse facility.  CSW contacted Sharon[facility] [phone], who requested that patient call her today.  CSW called patient back and asked that she call Jasmine DecemberSharon right now.  CSW also called [facility]-[phone] and left a message requesting a call back.  Patient sounds eager to get help.  Patient states she her Medicaid has been approved, she has applied for Food Stamps and she [facility] in the mail in about a week.

## 2013-11-03 ENCOUNTER — Encounter: Payer: Self-pay | Admitting: *Deleted

## 2013-11-03 LAB — BENZODIAZEPINES (GC/LC/MS), URINE
ALPRAZOLAMU: 206 ng/mL — AB
Alprazolam (GC/LC/MS), ur confirm: 179 ng/mL — AB
Clonazepam metabolite (GC/LC/MS), ur confirm: NEGATIVE ng/mL
DIAZEPAMUC: NEGATIVE ng/mL
Estazolam (GC/LC/MS), ur confirm: NEGATIVE ng/mL
Flunitrazepam metabolite (GC/LC/MS), ur confirm: NEGATIVE ng/mL
Flurazepam metabolite (GC/LC/MS), ur confirm: NEGATIVE ng/mL
LORAZEPAMU: NEGATIVE ng/mL
MIDAZOLAMU: NEGATIVE ng/mL
Nordiazepam (GC/LC/MS), ur confirm: NEGATIVE ng/mL
OXAZEPAMU: NEGATIVE ng/mL
TRIAZOLAMU: NEGATIVE ng/mL
Temazepam (GC/LC/MS), ur confirm: NEGATIVE ng/mL

## 2013-11-03 LAB — COCAINE METABOLITE (GC/LC/MS), URINE: Benzoylecgonine GC/MS Conf: >10000 ng/mL — AB

## 2013-11-03 LAB — CANNABANOIDS (GC/LC/MS), URINE: THC-COOH (GC/LC/MS), ur confirm: 79 ng/mL — AB

## 2013-11-03 LAB — CULTURE, OB URINE
COLONY COUNT: NO GROWTH
ORGANISM ID, BACTERIA: NO GROWTH

## 2013-11-03 NOTE — Progress Notes (Signed)
CSW received call from patient this am stating she has completed the phone interview with Sharon[facility] and will need get a TB test completed and sent to the facility before moving forward.  She also requests that CSW fax proof of pregnancy to the facility.  CSW faxed "Obstetrics Report" from 10/04/13.  Patient sounds very excited about this opportunity and continues to be grateful for CSW's intervention.

## 2013-11-04 LAB — PRESCRIPTION MONITORING PROFILE (19 PANEL)
Amphetamine/Meth: NEGATIVE ng/mL
Barbiturate Screen, Urine: NEGATIVE ng/mL
Buprenorphine, Urine: NEGATIVE ng/mL
CARISOPRODOL, URINE: NEGATIVE ng/mL
Creatinine, Urine: 181.49 mg/dL (ref >20.0)
Fentanyl, Ur: NEGATIVE ng/mL
MDMA URINE: NEGATIVE ng/mL
MEPERIDINE UR: NEGATIVE ng/mL
METHADONE SCREEN, URINE: NEGATIVE ng/mL
METHAQUALONE SCREEN (URINE): NEGATIVE ng/mL
NITRITES URINE, INITIAL: NEGATIVE ug/mL
Opiate Screen, Urine: NEGATIVE ng/mL
Oxycodone Screen, Ur: NEGATIVE ng/mL
PROPOXYPHENE: NEGATIVE ng/mL
Phencyclidine, Ur: NEGATIVE ng/mL
TRAMADOL UR: NEGATIVE ng/mL
Tapentadol, urine: NEGATIVE ng/mL
Zolpidem, Urine: NEGATIVE ng/mL
pH, Initial: 7.3 pH (ref 4.5–8.9)

## 2013-11-07 ENCOUNTER — Telehealth: Payer: Self-pay | Admitting: *Deleted

## 2013-11-07 DIAGNOSIS — N760 Acute vaginitis: Principal | ICD-10-CM

## 2013-11-07 DIAGNOSIS — B9689 Other specified bacterial agents as the cause of diseases classified elsewhere: Secondary | ICD-10-CM

## 2013-11-07 LAB — CERVICOVAGINAL ANCILLARY ONLY
BACTERIAL VAGINITIS: POSITIVE — AB
CANDIDA VAGINITIS: NEGATIVE

## 2013-11-07 MED ORDER — METRONIDAZOLE 500 MG PO TABS: 500.0000 mg | ORAL_TABLET | Freq: Two times a day (BID) | ORAL | Status: DC

## 2013-11-07 NOTE — Telephone Encounter (Signed)
Contacted patient and informed of results, reviewed possible causes, pt questioned continuing taking her keflex.  Educated patient take both forms of antibiotic since they are for 2 different infections.  Pt verbalizes understanding.

## 2013-11-07 NOTE — Telephone Encounter (Signed)
Message copied by Dorothyann PengHAIZLIP, Tamar Lipscomb E on Mon Nov 07, 2013  4:30 PM ------      Message from: Adam PhenixARNOLD, JAMES G      Created: Mon Nov 07, 2013  1:59 PM       BV-Flagyl 500 mg BID 7 days ------

## 2013-11-22 ENCOUNTER — Encounter: Payer: Self-pay | Admitting: Obstetrics & Gynecology

## 2013-11-29 ENCOUNTER — Ambulatory Visit (INDEPENDENT_AMBULATORY_CARE_PROVIDER_SITE_OTHER): Payer: Medicaid Other | Admitting: Obstetrics & Gynecology

## 2013-11-29 VITALS — BP 117/72 | HR 99 | Wt 162.0 lb

## 2013-11-29 DIAGNOSIS — A499 Bacterial infection, unspecified: Secondary | ICD-10-CM

## 2013-11-29 DIAGNOSIS — O0933 Supervision of pregnancy with insufficient antenatal care, third trimester: Secondary | ICD-10-CM

## 2013-11-29 DIAGNOSIS — Z111 Encounter for screening for respiratory tuberculosis: Secondary | ICD-10-CM

## 2013-11-29 DIAGNOSIS — N760 Acute vaginitis: Secondary | ICD-10-CM

## 2013-11-29 DIAGNOSIS — O0930 Supervision of pregnancy with insufficient antenatal care, unspecified trimester: Secondary | ICD-10-CM

## 2013-11-29 DIAGNOSIS — B9689 Other specified bacterial agents as the cause of diseases classified elsewhere: Secondary | ICD-10-CM

## 2013-11-29 DIAGNOSIS — Z3480 Encounter for supervision of other normal pregnancy, unspecified trimester: Secondary | ICD-10-CM

## 2013-11-29 MED ORDER — PRENATAL MULTIVITAMIN CH: 1.0000 | ORAL_TABLET | Freq: Every day | ORAL | Status: DC

## 2013-11-29 MED ORDER — METRONIDAZOLE 500 MG PO TABS: 500.0000 mg | ORAL_TABLET | Freq: Two times a day (BID) | ORAL | Status: DC

## 2013-11-29 NOTE — Patient Instructions (Signed)
Third Trimester of Pregnancy  The third trimester is from week 29 through week 42, months 7 through 9. The third trimester is a time when the fetus is growing rapidly. At the end of the ninth month, the fetus is about 20 inches in length and weighs 6 10 pounds.   BODY CHANGES  Your body goes through many changes during pregnancy. The changes vary from woman to woman.    Your weight will continue to increase. You can expect to gain 25 35 pounds (11 16 kg) by the end of the pregnancy.   You may begin to get stretch marks on your hips, abdomen, and breasts.   You may urinate more often because the fetus is moving lower into your pelvis and pressing on your bladder.   You may develop or continue to have heartburn as a result of your pregnancy.   You may develop constipation because certain hormones are causing the muscles that push waste through your intestines to slow down.   You may develop hemorrhoids or swollen, bulging veins (varicose veins).   You may have pelvic pain because of the weight gain and pregnancy hormones relaxing your joints between the bones in your pelvis. Back aches may result from over exertion of the muscles supporting your posture.   Your breasts will continue to grow and be tender. A yellow discharge may leak from your breasts called colostrum.   Your belly button may stick out.   You may feel short of breath because of your expanding uterus.   You may notice the fetus "dropping," or moving lower in your abdomen.   You may have a bloody mucus discharge. This usually occurs a few days to a week before labor begins.   Your cervix becomes thin and soft (effaced) near your due date.  WHAT TO EXPECT AT YOUR PRENATAL EXAMS   You will have prenatal exams every 2 weeks until week 36. Then, you will have weekly prenatal exams. During a routine prenatal visit:   You will be weighed to make sure you and the fetus are growing normally.   Your blood pressure is taken.   Your abdomen will be  measured to track your baby's growth.   The fetal heartbeat will be listened to.   Any test results from the previous visit will be discussed.   You may have a cervical check near your due date to see if you have effaced.  At around 36 weeks, your caregiver will check your cervix. At the same time, your caregiver will also perform a test on the secretions of the vaginal tissue. This test is to determine if a type of bacteria, Group B streptococcus, is present. Your caregiver will explain this further.  Your caregiver may ask you:   What your birth plan is.   How you are feeling.   If you are feeling the baby move.   If you have had any abnormal symptoms, such as leaking fluid, bleeding, severe headaches, or abdominal cramping.   If you have any questions.  Other tests or screenings that may be performed during your third trimester include:   Blood tests that check for low iron levels (anemia).   Fetal testing to check the health, activity level, and growth of the fetus. Testing is done if you have certain medical conditions or if there are problems during the pregnancy.  FALSE LABOR  You may feel small, irregular contractions that eventually go away. These are called Braxton Hicks contractions, or   false labor. Contractions may last for hours, days, or even weeks before true labor sets in. If contractions come at regular intervals, intensify, or become painful, it is best to be seen by your caregiver.   SIGNS OF LABOR    Menstrual-like cramps.   Contractions that are 5 minutes apart or less.   Contractions that start on the top of the uterus and spread down to the lower abdomen and back.   A sense of increased pelvic pressure or back pain.   A watery or bloody mucus discharge that comes from the vagina.  If you have any of these signs before the 37th week of pregnancy, call your caregiver right away. You need to go to the hospital to get checked immediately.  HOME CARE INSTRUCTIONS    Avoid all  smoking, herbs, alcohol, and unprescribed drugs. These chemicals affect the formation and growth of the baby.   Follow your caregiver's instructions regarding medicine use. There are medicines that are either safe or unsafe to take during pregnancy.   Exercise only as directed by your caregiver. Experiencing uterine cramps is a good sign to stop exercising.   Continue to eat regular, healthy meals.   Wear a good support bra for breast tenderness.   Do not use hot tubs, steam rooms, or saunas.   Wear your seat belt at all times when driving.   Avoid raw meat, uncooked cheese, cat litter boxes, and soil used by cats. These carry germs that can cause birth defects in the baby.   Take your prenatal vitamins.   Try taking a stool softener (if your caregiver approves) if you develop constipation. Eat more high-fiber foods, such as fresh vegetables or fruit and whole grains. Drink plenty of fluids to keep your urine clear or pale yellow.   Take warm sitz baths to soothe any pain or discomfort caused by hemorrhoids. Use hemorrhoid cream if your caregiver approves.   If you develop varicose veins, wear support hose. Elevate your feet for 15 minutes, 3 4 times a day. Limit salt in your diet.   Avoid heavy lifting, wear low heal shoes, and practice good posture.   Rest a lot with your legs elevated if you have leg cramps or low back pain.   Visit your dentist if you have not gone during your pregnancy. Use a soft toothbrush to brush your teeth and be gentle when you floss.   A sexual relationship may be continued unless your caregiver directs you otherwise.   Do not travel far distances unless it is absolutely necessary and only with the approval of your caregiver.   Take prenatal classes to understand, practice, and ask questions about the labor and delivery.   Make a trial run to the hospital.   Pack your hospital bag.   Prepare the baby's nursery.   Continue to go to all your prenatal visits as directed  by your caregiver.  SEEK MEDICAL CARE IF:   You are unsure if you are in labor or if your water has broken.   You have dizziness.   You have mild pelvic cramps, pelvic pressure, or nagging pain in your abdominal area.   You have persistent nausea, vomiting, or diarrhea.   You have a bad smelling vaginal discharge.   You have pain with urination.  SEEK IMMEDIATE MEDICAL CARE IF:    You have a fever.   You are leaking fluid from your vagina.   You have spotting or bleeding from your vagina.     You have severe abdominal cramping or pain.   You have rapid weight loss or gain.   You have shortness of breath with chest pain.   You notice sudden or extreme swelling of your face, hands, ankles, feet, or legs.   You have not felt your baby move in over an hour.   You have severe headaches that do not go away with medicine.   You have vision changes.  Document Released: 06/17/2001 Document Revised: 02/23/2013 Document Reviewed: 08/24/2012  ExitCare Patient Information 2014 [employer].

## 2013-12-02 LAB — QUANTIFERON TB GOLD ASSAY (BLOOD)
Interferon Gamma Release Assay: NEGATIVE
MITOGEN VALUE: 8.73 [IU]/mL
Quantiferon Nil Value: 0.02 IU/mL
Quantiferon Tb Ag Minus Nil Value: 0.02 IU/mL
TB AG VALUE: 0.04 [IU]/mL

## 2013-12-05 ENCOUNTER — Telehealth: Payer: Self-pay | Admitting: *Deleted

## 2013-12-05 NOTE — Telephone Encounter (Signed)
Per patient request - sent TB test results to Jasmine December at [facility] to fax number [phone] - LM for Jasmine December to call me if she had any questions.

## 2013-12-05 NOTE — Telephone Encounter (Signed)
[phone] 

## 2013-12-08 ENCOUNTER — Telehealth (HOSPITAL_COMMUNITY): Payer: Self-pay | Admitting: *Deleted

## 2013-12-13 ENCOUNTER — Encounter: Payer: MEDICAID | Admitting: Obstetrics & Gynecology

## 2013-12-20 ENCOUNTER — Ambulatory Visit (INDEPENDENT_AMBULATORY_CARE_PROVIDER_SITE_OTHER): Payer: Medicaid Other | Admitting: Obstetrics & Gynecology

## 2013-12-20 VITALS — BP 122/76 | HR 95 | Wt 162.0 lb

## 2013-12-20 DIAGNOSIS — K219 Gastro-esophageal reflux disease without esophagitis: Secondary | ICD-10-CM

## 2013-12-20 DIAGNOSIS — Z3480 Encounter for supervision of other normal pregnancy, unspecified trimester: Secondary | ICD-10-CM

## 2013-12-20 MED ORDER — PANTOPRAZOLE SODIUM 40 MG PO TBEC: 40.0000 mg | DELAYED_RELEASE_TABLET | Freq: Every day | ORAL | Status: DC

## 2013-12-20 NOTE — Progress Notes (Signed)
Pt states she has vaginal itching which started about 1 week ago. Pt was seen at [facility] for UTI and FFN. Per pt FFN was negative, NST reactive. Pt was unable to take Flagyl due to GI upset and will need another type of script for UTI.

## 2013-12-20 NOTE — Progress Notes (Signed)
Was seen at [facility] with pain and black stool. Was treated for UTI with IM injection and flagyl. She only took 2 Flagyl. Feels better, stool normal but serious GI reflux sx. Rx Protonix 40 mg

## 2013-12-20 NOTE — Patient Instructions (Signed)
Heartburn During Pregnancy   Heartburn is a burning sensation in the chest caused by stomach acid backing up into the esophagus. Heartburn is common in pregnancy because a certain hormone (progesterone) is released when a woman is pregnant. The progesterone hormone may relax the valve that separates the esophagus from the stomach. This allows acid to go up into the esophagus, causing heartburn. Heartburn may also happen in pregnancy because the enlarging uterus pushes up on the stomach, which pushes more acid into the esophagus. This is especially true in the later stages of pregnancy. Heartburn problems usually go away after giving birth.  CAUSES   Heartburn is caused by stomach acid backing up into the esophagus. During pregnancy, this may result from various things, including:   · The progesterone hormone.  · Changing hormone levels.  · The growing uterus pushing stomach acid upward.  · Large meals.  · Certain foods and drinks.  · Exercise.  · Increased acid production.  SIGNS AND SYMPTOMS   · Burning pain in the chest or lower throat.  · Bitter taste in the mouth.  · Coughing.  DIAGNOSIS   Your health care provider will typically diagnose heartburn by taking a careful history of your concern. Blood tests may be done to check for a certain type of bacteria that is associated with heartburn. Sometimes, heartburn is diagnosed by prescribing a heartburn medicine to see if the symptoms improve. In some cases, a procedure called an endoscopy may be done. In this procedure, a tube with a light and a camera on the end (endoscope) is used to examine the esophagus and the stomach.  TREATMENT   Treatment will vary depending on the severity of your symptoms. Your health care provider may recommend:  · Over-the-counter medicines (antacids, acid reducers) for mild heartburn.  · Prescription medicines to decrease stomach acid or to protect your stomach lining.  · Certain changes in your diet.  · Elevating the head of your bed  by putting blocks under the legs. This helps prevent stomach acid from backing up into the esophagus when you are lying down.  HOME CARE INSTRUCTIONS   · Only take over-the-counter or prescription medicines as directed by your health care provider.  · Raise the head of your bed by putting blocks under the legs if instructed to do so by your health care provider. Sleeping with more pillows is not effective because it only changes the position of your head.  · Do not exercise right after eating.  · Avoid eating 2 3 hours before bed. Do not lie down right after eating.  · Eat small meals throughout the day instead of three large meals.  · Identify foods and beverages that make your symptoms worse and avoid them. Foods you may want to avoid include:  · Peppers.  · Chocolate.  · High-fat foods, including fried foods.  · Spicy foods.  · Garlic and onions.  · Citrus fruits, including oranges, grapefruit, lemons, and limes.  · Food containing tomatoes or tomato products.  · Mint.  · Carbonated and caffeinated drinks.  · Vinegar.  SEEK MEDICAL CARE IF:  · You have abdominal pain of any kind.  · You feel burning in your upper abdomen or chest, especially after eating or lying down.  · You have nausea and vomiting.  · Your stomach feels upset after you eat.  SEEK IMMEDIATE MEDICAL CARE IF:   · You have severe chest pain that goes down your arm or into your   jaw or neck.  · You feel sweaty, dizzy, or lightheaded.  · You become short of breath.  · You vomit blood.  · You have difficulty or pain with swallowing.  · You have bloody or black, tarry stools.  · You have episodes of heartburn more than 3 times a week, for more than 2 weeks.  MAKE SURE YOU:  · Understand these instructions.  · Will watch your condition.  · Will get help right away if you are not doing well or get worse.  Document Released: 06/20/2000 Document Revised: 04/13/2013 Document Reviewed: 02/09/2013  ExitCare® Patient Information ©2014 [employer], [employer].

## 2014-01-03 ENCOUNTER — Encounter: Payer: MEDICAID | Admitting: Obstetrics & Gynecology

## 2014-01-05 ENCOUNTER — Telehealth: Payer: Self-pay | Admitting: *Deleted

## 2014-01-05 NOTE — Telephone Encounter (Signed)
Called pt about missed appt yesterday and stress that BTL papers need to be completed ASAP. The cell number has a full voicemail and unable to leave a message. I was able to enter a call back number and I entered the [place] phone number. I need to speak with patient about missed appt, BTL papers, and schedule next appt.

## 2014-01-07 ENCOUNTER — Emergency Department (HOSPITAL_COMMUNITY)
Admission: EM | Admit: 2014-01-07 | Discharge: 2014-01-07 | Disposition: A | Discharge status: Home / Self Care | Payer: Medicaid Other | Attending: Emergency Medicine | Admitting: Emergency Medicine

## 2014-01-07 ENCOUNTER — Encounter (HOSPITAL_COMMUNITY): Payer: Self-pay | Admitting: Emergency Medicine

## 2014-01-07 DIAGNOSIS — K089 Disorder of teeth and supporting structures, unspecified: Secondary | ICD-10-CM | POA: Diagnosis not present

## 2014-01-07 DIAGNOSIS — Z79899 Other long term (current) drug therapy: Secondary | ICD-10-CM | POA: Diagnosis not present

## 2014-01-07 DIAGNOSIS — Z8659 Personal history of other mental and behavioral disorders: Secondary | ICD-10-CM | POA: Diagnosis not present

## 2014-01-07 DIAGNOSIS — O9989 Other specified diseases and conditions complicating pregnancy, childbirth and the puerperium: Secondary | ICD-10-CM | POA: Insufficient documentation

## 2014-01-07 DIAGNOSIS — Z8619 Personal history of other infectious and parasitic diseases: Secondary | ICD-10-CM | POA: Diagnosis not present

## 2014-01-07 DIAGNOSIS — K0889 Other specified disorders of teeth and supporting structures: Secondary | ICD-10-CM

## 2014-01-07 DIAGNOSIS — K029 Dental caries, unspecified: Secondary | ICD-10-CM | POA: Diagnosis not present

## 2014-01-07 DIAGNOSIS — F17200 Nicotine dependence, unspecified, uncomplicated: Secondary | ICD-10-CM | POA: Insufficient documentation

## 2014-01-07 LAB — COMPREHENSIVE METABOLIC PANEL
ALBUMIN: 2.5 g/dL — AB (ref 3.5–5.2)
ALK PHOS: 180 U/L — AB (ref 39–117)
ALT: 11 U/L (ref 0–35)
AST: 15 U/L (ref 0–37)
Anion gap: 9 (ref 5–15)
BUN: 9 mg/dL (ref 6–23)
CHLORIDE: 101 meq/L (ref 96–112)
CO2: 27 mEq/L (ref 19–32)
Calcium: 8.3 mg/dL — ABNORMAL LOW (ref 8.4–10.5)
Creatinine, Ser: 0.56 mg/dL (ref 0.50–1.10)
GFR calc Af Amer: >90 mL/min (ref >90)
GFR calc non Af Amer: >90 mL/min (ref >90)
Glucose, Bld: 86 mg/dL (ref 70–99)
POTASSIUM: 4.1 meq/L (ref 3.7–5.3)
Sodium: 137 mEq/L (ref 137–147)
TOTAL PROTEIN: 6.5 g/dL (ref 6.0–8.3)
Total Bilirubin: 0.2 mg/dL — ABNORMAL LOW (ref 0.3–1.2)

## 2014-01-07 LAB — ACETAMINOPHEN LEVEL: Acetaminophen (Tylenol), Serum: <15 ug/mL (ref 10–30)

## 2014-01-07 MED ORDER — LIDOCAINE VISCOUS 2 % MT SOLN
15.0000 mL | Freq: Once | OROMUCOSAL | Status: AC
  Administered 2014-01-07: 15 mL via OROMUCOSAL
  Filled 2014-01-07: qty 15

## 2014-01-07 MED ORDER — TRAMADOL HCL 50 MG PO TABS
50.0000 mg | ORAL_TABLET | Freq: Once | ORAL | Status: AC
  Administered 2014-01-07: 50 mg via ORAL
  Filled 2014-01-07: qty 1

## 2014-01-07 MED ORDER — TRAMADOL HCL 50 MG PO TABS: 50.0000 mg | ORAL_TABLET | Freq: Four times a day (QID) | ORAL | Status: DC | PRN

## 2014-01-07 MED ORDER — AMOXICILLIN 250 MG PO CAPS
500.0000 mg | ORAL_CAPSULE | Freq: Once | ORAL | Status: AC
  Administered 2014-01-07: 500 mg via ORAL
  Filled 2014-01-07: qty 2

## 2014-01-07 MED ORDER — CEPHALEXIN 500 MG PO CAPS: 500.0000 mg | ORAL_CAPSULE | Freq: Four times a day (QID) | ORAL | Status: DC

## 2014-01-07 NOTE — ED Provider Notes (Signed)
CSN: [id number]     Arrival date & time 01/07/14  2027 History   First MD Initiated Contact with Patient 01/07/14 2058     Chief Complaint  Patient presents with  . Dental Pain     (Consider location/radiation/quality/duration/timing/severity/associated sxs/prior Treatment) HPI Comments: Patient is a 28 year old female who presents to the emergency department with complaint of toothache. The patient states this is been going on for about 4 or 5 days. She is [redacted] weeks pregnant, with an estimated delivery date of July 26. She has been taking Tylenol for the pain, she's also been saturating a Q-tip with Clorox applying this to her tooth to assist with her pain. The patient states the pain is just getting progressively worse. The patient also admits to taking as many as 16 tablets of Tylenol 500 mg today. She does not have a dentist and states that [place]  Patient is a 28 y.o. female presenting with tooth pain. The history is provided by the patient.  Dental Pain Location:  Lower Associated symptoms: no neck pain     Past Medical History  Diagnosis Date  . ADHD (attention deficit hyperactivity disorder)   . Bipolar 1 disorder   . Manic-depressive   . Polysubstance abuse     cocaine, opiates, benzos, marijuana  . Trichomonal vaginitis    Past Surgical History  Procedure Laterality Date  . Dilation and curettage of uterus     Family History  Problem Relation Age of Onset  . COPD Mother    History  Substance Use Topics  . Smoking status: Current Every Day Smoker -- 1.00 packs/day for 12 years    Types: Cigarettes  . Smokeless tobacco: Never Used  . Alcohol Use: No   OB History   Grav Para Term Preterm Abortions TAB SAB Ect Mult Living   7 4 4  2  2   4      Review of Systems  Constitutional: Negative for activity change.       All ROS Neg except as noted in HPI  HENT: Positive for dental problem. Negative  for nosebleeds.   Eyes: Negative for photophobia and discharge.  Respiratory: Negative for cough, shortness of breath and wheezing.   Cardiovascular: Negative for chest pain and palpitations.  Gastrointestinal: Negative for abdominal pain and blood in stool.  Genitourinary: Negative for dysuria, frequency and hematuria.  Musculoskeletal: Negative for arthralgias, back pain and neck pain.  Skin: Negative.   Neurological: Negative for dizziness, seizures and speech difficulty.  Psychiatric/Behavioral: Negative for hallucinations and confusion.      Allergies  Review of patient's allergies indicates no known allergies.  Home Medications   Prior to Admission medications   Medication Sig Start Date End Date Taking? Authorizing Provider  acetaminophen (TYLENOL) 500 MG tablet Take 2,000 mg by mouth every 3 (three) hours as needed.   Yes Historical Provider, MD  cephALEXin (KEFLEX) 500 MG capsule Take 500 mg by mouth daily.   Yes Historical Provider, MD  pantoprazole (PROTONIX) 40 MG tablet Take 1 tablet (40 mg total) by mouth daily. 12/20/13 12/20/14 Yes Adam PhenixJames G Arnold, MD  Prenatal Vit-Fe Fumarate-FA (PRENATAL MULTIVITAMIN) TABS tablet Take 1 tablet by mouth daily at 12 noon. 11/29/13  Yes Adam PhenixJames G Arnold, MD   BP 122/86  Pulse 91  Temp(Src) 97.5 F (36.4 C) (Oral)  Resp 20  SpO2 97% Physical Exam  Nursing note and vitals reviewed. Constitutional: She  is oriented to person, place, and time. She appears well-developed and well-nourished.  Non-toxic appearance.  HENT:  Head: Normocephalic.  Right Ear: Tympanic membrane and external ear normal.  Left Ear: Tympanic membrane and external ear normal.  Multiple dental caries present. The right second molar of the right lower jaw is decayed to the gum line. There is also a cavity of the third right lower molar. There is no visible abscess. There is no swelling under the tongue. The airway is patent.  Eyes: EOM and lids are normal. Pupils are  equal, round, and reactive to light.  Neck: Normal range of motion. Neck supple. Carotid bruit is not present.  Cardiovascular: Normal rate, regular rhythm, normal heart sounds, intact distal pulses and normal pulses.   Pulmonary/Chest: Breath sounds normal. No respiratory distress.  Abdominal: Soft. Bowel sounds are normal.  Abdomen is gravid, consistent with pregnancy.  Musculoskeletal: Normal range of motion.  Lymphadenopathy:       Head (right side): No submandibular adenopathy present.       Head (left side): No submandibular adenopathy present.    She has no cervical adenopathy.  Neurological: She is alert and oriented to person, place, and time. She has normal strength. No cranial nerve deficit or sensory deficit.  Skin: Skin is warm and dry.  Psychiatric: She has a normal mood and affect. Her speech is normal.    ED Course  Procedures (including critical care time) Labs Review Labs Reviewed - No data to display  Imaging Review No results found.   EKG Interpretation None      MDM I have discussed in detail with the patient the danger of taking Tylenol outside of the realm of the written instructions on the bottle. I discussed liver damage, and actual possibility of death related to Tylenol overdose. Also discussed with her the possibility of danger to her unborn child.  The patient will be treated with Amoxil 500 mg 3 times daily. Ultram every 6 hours for 3 or 4 days only. Pt to see her Gyn for additional assistance with this problem.   Final diagnoses:  None    **I have reviewed nursing notes, vital signs, and all appropriate lab and imaging results for this patient.Kathie Dike*    Judge Duque M Vannary Greening, PA-C 01/07/14 2150

## 2014-01-07 NOTE — Discharge Instructions (Signed)
It is extremely important that you take your medications as prescribed only. Please use Keflex 4 times daily with each meal and at bedtime. Please do not use any Tylenol until Monday morning. May use to Tylenol every 4-6 hours if needed for pain. May use Ultram for more severe pain. This medication may cause drowsiness, please use with caution. Please see a dentist as sone as possible. Dental Pain Toothache is pain in or around a tooth. It may get worse with chewing or with cold or heat.  HOME CARE  Your dentist may use a numbing medicine during treatment. If so, you may need to avoid eating until the medicine wears off. Ask your dentist about this.  Only take medicine as told by your dentist or doctor.  Avoid chewing food near the painful tooth until after all treatment is done. Ask your dentist about this. GET HELP RIGHT AWAY IF:   The problem gets worse or new problems appear.  You have a fever.  There is redness and puffiness (swelling) of the face, jaw, or neck.  You cannot open your mouth.  There is pain in the jaw.  There is very bad pain that is not helped by medicine. MAKE SURE YOU:   Understand these instructions.  Will watch your condition.  Will get help right away if you are not doing well or get worse. Document Released: 12/10/2007 Document Revised: 09/15/2011 Document Reviewed: 12/10/2007 [facility] Patient Information 2015 [place]. This information is not intended to replace advice given to you by your health care provider. Make sure you discuss any questions you have with your health care provider.

## 2014-01-07 NOTE — ED Notes (Signed)
Pt c/o right sided tooth pain x 4days. States it is now radiating down her throat.

## 2014-01-07 NOTE — ED Notes (Signed)
Pt 36-[redacted] weeks pregnant, EDD 7-26, [place] and last visit 2 weeks ago and normal per pt, pt did state that baby has been moving but "not much as he should be", pt states this is here 7th pregnancy, pt denies having a dentist and that she took a Q-tip with clororax and applied to her tooth that has three holes in it per pt and that it made it worse

## 2014-01-08 NOTE — ED Provider Notes (Signed)
Medical screening examination/treatment/procedure(s) were performed by non-physician practitioner and as supervising physician I was immediately available for consultation/collaboration.   EKG Interpretation None        Vanetta MuldersScott Renesha Lizama, MD 01/08/14 [phone]

## 2014-01-10 ENCOUNTER — Encounter (HOSPITAL_COMMUNITY): Payer: Self-pay | Admitting: *Deleted

## 2014-01-10 ENCOUNTER — Inpatient Hospital Stay (HOSPITAL_COMMUNITY)
Admission: AD | Admit: 2014-01-10 | Discharge: 2014-01-12 | DRG: 775 | Disposition: A | Discharge status: Home / Self Care | Payer: Medicaid Other | Source: Ambulatory Visit | Attending: Obstetrics & Gynecology | Admitting: Obstetrics & Gynecology

## 2014-01-10 ENCOUNTER — Encounter (HOSPITAL_COMMUNITY): Payer: Medicaid Other | Admitting: Anesthesiology

## 2014-01-10 ENCOUNTER — Inpatient Hospital Stay (HOSPITAL_COMMUNITY): Payer: Medicaid Other | Admitting: Anesthesiology

## 2014-01-10 DIAGNOSIS — F1910 Other psychoactive substance abuse, uncomplicated: Secondary | ICD-10-CM | POA: Diagnosis present

## 2014-01-10 DIAGNOSIS — O99320 Drug use complicating pregnancy, unspecified trimester: Secondary | ICD-10-CM

## 2014-01-10 DIAGNOSIS — K219 Gastro-esophageal reflux disease without esophagitis: Secondary | ICD-10-CM | POA: Diagnosis present

## 2014-01-10 DIAGNOSIS — O99334 Smoking (tobacco) complicating childbirth: Secondary | ICD-10-CM | POA: Diagnosis present

## 2014-01-10 DIAGNOSIS — O99344 Other mental disorders complicating childbirth: Secondary | ICD-10-CM | POA: Diagnosis present

## 2014-01-10 DIAGNOSIS — O0933 Supervision of pregnancy with insufficient antenatal care, third trimester: Secondary | ICD-10-CM

## 2014-01-10 DIAGNOSIS — F909 Attention-deficit hyperactivity disorder, unspecified type: Secondary | ICD-10-CM | POA: Diagnosis present

## 2014-01-10 DIAGNOSIS — F319 Bipolar disorder, unspecified: Secondary | ICD-10-CM

## 2014-01-10 DIAGNOSIS — O479 False labor, unspecified: Secondary | ICD-10-CM | POA: Diagnosis present

## 2014-01-10 DIAGNOSIS — IMO0001 Reserved for inherently not codable concepts without codable children: Secondary | ICD-10-CM

## 2014-01-10 DIAGNOSIS — F1920 Other psychoactive substance dependence, uncomplicated: Secondary | ICD-10-CM

## 2014-01-10 LAB — RAPID URINE DRUG SCREEN, HOSP PERFORMED
Amphetamines: NOT DETECTED
BARBITURATES: NOT DETECTED
Benzodiazepines: POSITIVE — AB
Cocaine: POSITIVE — AB
Opiates: POSITIVE — AB
Tetrahydrocannabinol: POSITIVE — AB

## 2014-01-10 LAB — CBC WITH DIFFERENTIAL/PLATELET
BASOS ABS: 0 10*3/uL (ref 0.0–0.1)
BASOS PCT: 0 % (ref 0–1)
EOS ABS: 0.1 10*3/uL (ref 0.0–0.7)
EOS PCT: 1 % (ref 0–5)
HEMATOCRIT: 36 % (ref 36.0–46.0)
HEMOGLOBIN: 12.3 g/dL (ref 12.0–15.0)
Lymphocytes Relative: 21 % (ref 12–46)
Lymphs Abs: 2.1 10*3/uL (ref 0.7–4.0)
MCH: 30.4 pg (ref 26.0–34.0)
MCHC: 34.2 g/dL (ref 30.0–36.0)
MCV: 88.9 fL (ref 78.0–100.0)
MONO ABS: 0.5 10*3/uL (ref 0.1–1.0)
MONOS PCT: 5 % (ref 3–12)
Neutro Abs: 7.3 10*3/uL (ref 1.7–7.7)
Neutrophils Relative %: 73 % (ref 43–77)
Platelets: 158 10*3/uL (ref 150–400)
RBC: 4.05 MIL/uL (ref 3.87–5.11)
RDW: 13.7 % (ref 11.5–15.5)
WBC: 10 10*3/uL (ref 4.0–10.5)

## 2014-01-10 LAB — TYPE AND SCREEN
ABO/RH(D): B POS
Antibody Screen: NEGATIVE

## 2014-01-10 LAB — GROUP B STREP BY PCR: Group B strep by PCR: NEGATIVE

## 2014-01-10 LAB — OB RESULTS CONSOLE GBS: STREP GROUP B AG: NEGATIVE

## 2014-01-10 LAB — RPR

## 2014-01-10 MED ORDER — ONDANSETRON HCL 4 MG/2ML IJ SOLN: 4.0000 mg | INTRAMUSCULAR | Status: DC | PRN

## 2014-01-10 MED ORDER — NICOTINE 14 MG/24HR TD PT24
14.0000 mg | MEDICATED_PATCH | Freq: Every day | TRANSDERMAL | Status: DC
  Administered 2014-01-10 – 2014-01-12 (×3): 14 mg via TRANSDERMAL
  Filled 2014-01-10 (×6): qty 1

## 2014-01-10 MED ORDER — EPHEDRINE 5 MG/ML INJ
10.0000 mg | INTRAVENOUS | Status: DC | PRN
  Filled 2014-01-10: qty 2

## 2014-01-10 MED ORDER — SIMETHICONE 80 MG PO CHEW: 80.0000 mg | CHEWABLE_TABLET | ORAL | Status: DC | PRN

## 2014-01-10 MED ORDER — LACTATED RINGERS IV SOLN
500.0000 mL | Freq: Once | INTRAVENOUS | Status: AC
  Administered 2014-01-10: 1000 mL via INTRAVENOUS

## 2014-01-10 MED ORDER — TETANUS-DIPHTH-ACELL PERTUSSIS 5-2.5-18.5 LF-MCG/0.5 IM SUSP
0.5000 mL | Freq: Once | INTRAMUSCULAR | Status: DC
  Filled 2014-01-10: qty 0.5

## 2014-01-10 MED ORDER — SENNOSIDES-DOCUSATE SODIUM 8.6-50 MG PO TABS
2.0000 | ORAL_TABLET | ORAL | Status: DC
  Administered 2014-01-10 – 2014-01-12 (×2): 2 via ORAL
  Filled 2014-01-10 (×2): qty 2

## 2014-01-10 MED ORDER — CITRIC ACID-SODIUM CITRATE 334-500 MG/5ML PO SOLN
30.0000 mL | ORAL | Status: DC | PRN
  Administered 2014-01-10: 30 mL via ORAL
  Filled 2014-01-10: qty 15

## 2014-01-10 MED ORDER — IBUPROFEN 600 MG PO TABS: 600.0000 mg | ORAL_TABLET | Freq: Four times a day (QID) | ORAL | Status: DC | PRN

## 2014-01-10 MED ORDER — FENTANYL 2.5 MCG/ML BUPIVACAINE 1/10 % EPIDURAL INFUSION (WH - ANES)
14.0000 mL/h | INTRAMUSCULAR | Status: DC | PRN
  Administered 2014-01-10: 14 mL/h via EPIDURAL

## 2014-01-10 MED ORDER — BENZOCAINE-MENTHOL 20-0.5 % EX AERO
1.0000 "application " | INHALATION_SPRAY | CUTANEOUS | Status: DC | PRN
  Filled 2014-01-10 (×2): qty 56

## 2014-01-10 MED ORDER — LANOLIN HYDROUS EX OINT: TOPICAL_OINTMENT | CUTANEOUS | Status: DC | PRN

## 2014-01-10 MED ORDER — LIDOCAINE HCL (PF) 1 % IJ SOLN
INTRAMUSCULAR | Status: DC | PRN
  Administered 2014-01-10 (×2): 8 mL

## 2014-01-10 MED ORDER — OXYTOCIN BOLUS FROM INFUSION
500.0000 mL | INTRAVENOUS | Status: DC
  Administered 2014-01-10: 500 mL via INTRAVENOUS

## 2014-01-10 MED ORDER — ONDANSETRON HCL 4 MG/2ML IJ SOLN: 4.0000 mg | Freq: Four times a day (QID) | INTRAMUSCULAR | Status: DC | PRN

## 2014-01-10 MED ORDER — FENTANYL 2.5 MCG/ML BUPIVACAINE 1/10 % EPIDURAL INFUSION (WH - ANES)
INTRAMUSCULAR | Status: AC
  Administered 2014-01-10: 14 mL/h via EPIDURAL
  Filled 2014-01-10: qty 125

## 2014-01-10 MED ORDER — EPHEDRINE 5 MG/ML INJ
INTRAVENOUS | Status: AC
  Filled 2014-01-10: qty 4

## 2014-01-10 MED ORDER — FLEET ENEMA 7-19 GM/118ML RE ENEM: 1.0000 | ENEMA | RECTAL | Status: DC | PRN

## 2014-01-10 MED ORDER — ACETAMINOPHEN 325 MG PO TABS: 650.0000 mg | ORAL_TABLET | ORAL | Status: DC | PRN

## 2014-01-10 MED ORDER — DIPHENHYDRAMINE HCL 50 MG/ML IJ SOLN: 12.5000 mg | INTRAMUSCULAR | Status: DC | PRN

## 2014-01-10 MED ORDER — LACTATED RINGERS IV SOLN
INTRAVENOUS | Status: DC
  Administered 2014-01-10 (×2): via INTRAVENOUS

## 2014-01-10 MED ORDER — CEPHALEXIN 500 MG PO CAPS
500.0000 mg | ORAL_CAPSULE | Freq: Four times a day (QID) | ORAL | Status: DC
  Administered 2014-01-10 – 2014-01-12 (×8): 500 mg via ORAL
  Filled 2014-01-10 (×12): qty 1

## 2014-01-10 MED ORDER — FENTANYL 2.5 MCG/ML BUPIVACAINE 1/10 % EPIDURAL INFUSION (WH - ANES)
INTRAMUSCULAR | Status: DC | PRN
  Administered 2014-01-10: 14 mL/h via EPIDURAL

## 2014-01-10 MED ORDER — PHENYLEPHRINE 40 MCG/ML (10ML) SYRINGE FOR IV PUSH (FOR BLOOD PRESSURE SUPPORT)
80.0000 ug | PREFILLED_SYRINGE | INTRAVENOUS | Status: DC | PRN
  Filled 2014-01-10: qty 2

## 2014-01-10 MED ORDER — PHENYLEPHRINE 40 MCG/ML (10ML) SYRINGE FOR IV PUSH (FOR BLOOD PRESSURE SUPPORT)
PREFILLED_SYRINGE | INTRAVENOUS | Status: AC
  Filled 2014-01-10: qty 10

## 2014-01-10 MED ORDER — DIBUCAINE 1 % RE OINT
1.0000 "application " | TOPICAL_OINTMENT | RECTAL | Status: DC | PRN
  Filled 2014-01-10: qty 28

## 2014-01-10 MED ORDER — LIDOCAINE HCL (PF) 1 % IJ SOLN
30.0000 mL | INTRAMUSCULAR | Status: DC | PRN
  Filled 2014-01-10: qty 30

## 2014-01-10 MED ORDER — IBUPROFEN 600 MG PO TABS
600.0000 mg | ORAL_TABLET | Freq: Four times a day (QID) | ORAL | Status: DC
  Administered 2014-01-10 – 2014-01-12 (×8): 600 mg via ORAL
  Filled 2014-01-10 (×8): qty 1

## 2014-01-10 MED ORDER — DIPHENHYDRAMINE HCL 25 MG PO CAPS: 25.0000 mg | ORAL_CAPSULE | Freq: Four times a day (QID) | ORAL | Status: DC | PRN

## 2014-01-10 MED ORDER — LACTATED RINGERS IV SOLN: 500.0000 mL | INTRAVENOUS | Status: DC | PRN

## 2014-01-10 MED ORDER — WITCH HAZEL-GLYCERIN EX PADS: 1.0000 "application " | MEDICATED_PAD | CUTANEOUS | Status: DC | PRN

## 2014-01-10 MED ORDER — PRENATAL MULTIVITAMIN CH
1.0000 | ORAL_TABLET | Freq: Every day | ORAL | Status: DC
  Administered 2014-01-11 – 2014-01-12 (×2): 1 via ORAL
  Filled 2014-01-10 (×2): qty 1

## 2014-01-10 MED ORDER — OXYCODONE-ACETAMINOPHEN 5-325 MG PO TABS: 1.0000 | ORAL_TABLET | ORAL | Status: DC | PRN

## 2014-01-10 MED ORDER — OXYTOCIN 40 UNITS IN LACTATED RINGERS INFUSION - SIMPLE MED
62.5000 mL/h | INTRAVENOUS | Status: DC
  Filled 2014-01-10: qty 1000

## 2014-01-10 MED ORDER — ONDANSETRON HCL 4 MG PO TABS: 4.0000 mg | ORAL_TABLET | ORAL | Status: DC | PRN

## 2014-01-10 MED ORDER — OXYCODONE-ACETAMINOPHEN 5-325 MG PO TABS
1.0000 | ORAL_TABLET | ORAL | Status: DC | PRN
  Administered 2014-01-11 – 2014-01-12 (×3): 1 via ORAL
  Administered 2014-01-12: 2 via ORAL
  Filled 2014-01-10 (×2): qty 1
  Filled 2014-01-10: qty 2
  Filled 2014-01-10: qty 1

## 2014-01-10 MED ORDER — ZOLPIDEM TARTRATE 5 MG PO TABS: 5.0000 mg | ORAL_TABLET | Freq: Every evening | ORAL | Status: DC | PRN

## 2014-01-10 NOTE — Anesthesia Preprocedure Evaluation (Signed)
Anesthesia Evaluation  Patient identified by MRN, date of birth, ID band Patient awake    Reviewed: Allergy & Precautions, H&P , NPO status , Patient's Chart, lab work & pertinent test results  Airway Mallampati: I TM Distance: >3 FB Neck ROM: full    Dental no notable dental hx.    Pulmonary Current Smoker,    Pulmonary exam normal       Cardiovascular negative cardio ROS  Rhythm:regular     Neuro/Psych PSYCHIATRIC DISORDERS Bipolar Disorder negative neurological ROS     GI/Hepatic Neg liver ROS, GERD-  Medicated and Controlled,  Endo/Other  negative endocrine ROS  Renal/GU      Musculoskeletal   Abdominal Normal abdominal exam  (+)   Peds  Hematology negative hematology ROS (+)   Anesthesia Other Findings   Reproductive/Obstetrics (+) Pregnancy                           Anesthesia Physical Anesthesia Plan  ASA: II  Anesthesia Plan: Epidural   Post-op Pain Management:    Induction:   Airway Management Planned:   Additional Equipment:   Intra-op Plan:   Post-operative Plan:   Informed Consent: I have reviewed the patients History and Physical, chart, labs and discussed the procedure including the risks, benefits and alternatives for the proposed anesthesia with the patient or authorized representative who has indicated his/her understanding and acceptance.     Plan Discussed with:   Anesthesia Plan Comments:         Anesthesia Quick Evaluation

## 2014-01-10 NOTE — MAU Note (Addendum)
Fell yesterday, no movement since 1900 yesterday. Contracting started at 0730 this morning., some blood streaked mucous. Late PNC. Had gone to clinic to see if Dr Debroah LoopArnold was there, MD not in - nurse called ambulance

## 2014-01-10 NOTE — MAU Note (Signed)
Patient presents with complaints of contractions and decreased fetal movement.

## 2014-01-10 NOTE — Progress Notes (Signed)
Contacted Colleen/Social Services regarding pt's status.  Chaplain at bedside for support.

## 2014-01-10 NOTE — H&P (Signed)
Attestation of Attending Supervision of Fellow: Evaluation and management procedures were performed by the Fellow under my supervision and collaboration.  I have reviewed the Fellow's note and chart, and I agree with the management and plan.    

## 2014-01-10 NOTE — Progress Notes (Signed)
01/10/14 1100  Clinical Encounter Type  Visited With Patient  Visit Type Spiritual support;Social support  Spiritual Encounters  Spiritual Needs Emotional (labor/epidural support)  Stress Factors  Patient Stress Factors Financial concerns;Lack of caregivers;Family relationships (per pt:  little support/no family, homeless, using drugs)   Visited twice with Penny Hale this morning, first when she arrived to MAU and then in BS as she prepared for and received epidural.  She has been strong and anxious through contractions, expressing gratitude for staff's support.  She laments not having her older children (13, 7, 4, 2?) with her and has stated that she knows that this baby will not be able to stay with her either; right now, she is focusing on labor and delivery, rather than looking further into the future.  She desires to see and spend time with her baby after delivery. Provided pastoral presence, encouragement, affirmation, reflective listening, and caring acceptance, building strong rapport.  Penny Hale asked, "Can we just tell them [my boss] that I kidnapped you for the day?" as a statement of her appreciation for my care and desire for my ongoing support.  I will check in as my schedule allows.  Penny Hale and [name], RN plan to page me as needed, particularly as she prepares for delivery. Thank you.  [address]Chaplain Cassidi Modesitt [place], [place] [phone]

## 2014-01-10 NOTE — Progress Notes (Signed)
01/10/14 1700  Clinical Encounter Type  Visited With Patient  Visit Type Follow-up;Spiritual support;Social support  Spiritual Encounters  Spiritual Needs Emotional (support during delivery and postpartum)   Visited with Penny Hale twice this afternoon, first to support her during labor (for which she was alone; cousin Penny Hale was unable to come) and then to follow up after she had some time with baby.  Penny Hale was very grateful for our team's support and for chaplain presence throughout her significant experiences today.  Provided pastoral presence and emotional support through delivery, as well as reflective listening as she worked through some of her feelings (especially sorrow and dread about having to give up baby).  Offered baby blessing and prayer, as well.  Will refer to chaplain for follow-up support tomorrow and plan to visit personally on Thursday prior [facility].  Please also page as needs arise.  Thank you.  [address] Penny Brzozowski [place], [place] [phone]

## 2014-01-10 NOTE — Anesthesia Procedure Notes (Signed)
Epidural Patient location during procedure: OB Start time: 01/10/2014 10:38 AM End time: 01/10/2014 10:48 AM  Staffing Anesthesiologist: Leilani AbleHATCHETT, Ishana Blades Performed by: anesthesiologist   Preanesthetic Checklist Completed: patient identified, surgical consent, pre-op evaluation, timeout performed, IV checked, risks and benefits discussed and monitors and equipment checked  Epidural Patient position: sitting Prep: site prepped and draped and DuraPrep Patient monitoring: continuous pulse ox and blood pressure Approach: midline Location: L3-L4 Injection technique: LOR air  Needle:  Needle type: Tuohy  Needle gauge: 17 G Needle length: 9 cm and 9 Needle insertion depth: 6 cm Catheter type: closed end flexible Catheter size: 19 Gauge Catheter at skin depth: 10 cm Test dose: negative and Other  Assessment Sensory level: T10 Events: blood not aspirated, injection not painful, no injection resistance, negative IV test and no paresthesia  Additional Notes Reason for block:procedure for pain

## 2014-01-10 NOTE — MAU Note (Signed)
Assumed care of patient.

## 2014-01-10 NOTE — H&P (Signed)
LABOR ADMISSION HISTORY AND PHYSICAL  Penny KennedyHeather C Tith is a 28 y.o. female [phone] with IUP at 8048w2d by 23 week US presenting for active labor. Pt presented to MAU with regular contractions. No vb, lof. +FM.     P[facility]are at [facility] since 27 wks but only had 2 visits. Prior to that was admitted to the hospital for pyelonephritis. Prenatal course complicated by multiple substance abuse. Most recent use of crack cocaine was yesterday, she also has been taking vicodin for the last 4 days and smoking cigarettes. Does not have custody of her other children. US with [facility] but never had f/u anatomy.  Wanted to have her tubes tied but never signed papers.    Prenatal History/Complications:  Past Medical History: Past Medical History  Diagnosis Date  . ADHD (attention deficit hyperactivity disorder)   . Bipolar 1 disorder   . Manic-depressive   . Polysubstance abuse     cocaine, opiates, benzos, marijuana  . Trichomonal vaginitis     Past Surgical History: Past Surgical History  Procedure Laterality Date  . Dilation and curettage of uterus      Obstetrical History: OB History   Grav Para Term Preterm Abortions TAB SAB Ect Mult Living   7 4 4  2  2   4     G1- term, nsvd, 8lb5oz G2- term, nsvd, 6lb3oz G3- term, nsvd,  g4- sab G5- sab G6-  term, nsvd G7- current  Social History: History   Social History  . Marital Status: Single    Spouse Name: N/A    Number of Children: N/A  . Years of Education: N/A   Social History Main Topics  . Smoking status: Current Every Day Smoker -- 1.00 packs/day for 12 years    Types: Cigarettes  . Smokeless tobacco: Never Used  . Alcohol Use: No  . Drug Use: Yes    Special: Cocaine, Marijuana     Comment: last used 3 weeks ago  . Sexual Activity: Yes    Birth Control/ Protection: Condom   Other Topics Concern  . None   Social History Narrative  . None    Family History: Family History  Problem Relation Age of Onset  . COPD Mother      Allergies: No Known Allergies  Prescriptions prior to admission  Medication Sig Dispense Refill  . acetaminophen (TYLENOL) 500 MG tablet Take 2,000 mg by mouth every 3 (three) hours as needed.      . cephALEXin (KEFLEX) 500 MG capsule Take 500 mg by mouth daily.      . cephALEXin (KEFLEX) 500 MG capsule Take 1 capsule (500 mg total) by mouth 4 (four) times daily.  28 capsule  0  . pantoprazole (PROTONIX) 40 MG tablet Take 1 tablet (40 mg total) by mouth daily.  30 tablet  1  . Prenatal Vit-Fe Fumarate-FA (PRENATAL MULTIVITAMIN) TABS tablet Take 1 tablet by mouth daily at 12 noon.  90 tablet  3  . traMADol (ULTRAM) 50 MG tablet Take 1 tablet (50 mg total) by mouth every 6 (six) hours as needed.  15 tablet  0     Review of Systems   All systems reviewed and negative except as stated in HPI  Blood pressure 95/77, pulse 94, temperature 96.7 F (35.9 C), temperature source Oral, resp. rate 20, height 5\' 5"  (1.651 m), weight 73.483 kg (162 lb), SpO2 96.00%, unknown if currently breastfeeding. General appearance: alert, cooperative and no distress Lungs: clear to auscultation bilaterally Heart: regular rate  and rhythm Abdomen: soft, non-tender; bowel sounds normal Extremities: Homans sign is negative, no sign of DVT  Presentation: cephalic Fetal monitoringBaseline: 120 bpm, Variability: Good {> 6 bpm), Accelerations: Reactive and Decelerations: Early Uterine activity 3-6 min   Dilation: 6 Effacement (%): 90 Station: 0 Exam by:: Dr. Reola CalkinsBeck   Prenatal labs: ABO, Rh: --/--/B POS (07/07 1001) Antibody: NEG (07/07 1001) Rubella:   RPR: NON REAC (04/28 1044)  HBsAg: NEGATIVE (04/28 1044)  HIV: NONREACTIVE (04/28 1044)  GBS: Negative (07/07 0000)  1 hr Glucola 78 Genetic screening  Too late Anatomy US left EIF   Prenatal Transfer Tool  Maternal Diabetes: No Genetic Screening: Declined Maternal Ultrasounds/Referrals: Abnormal:  Findings:   Isolated EIF (echogenic intracardiac  focus) Fetal Ultrasounds or other Referrals:  None Maternal Substance Abuse:  Yes:  Type: Smoker, Marijuana, Cocaine, Prescription drugs Significant Maternal Medications:  None Significant Maternal Lab Results: Lab values include: Group B Strep negative     Results for orders placed during the hospital encounter of 01/10/14 (from the past 24 hour(s))  OB RESULTS CONSOLE GBS   Collection Time    01/10/14 12:00 AM      Result Value Ref Range   GBS Negative    GROUP B STREP BY PCR   Collection Time    01/10/14  9:37 AM      Result Value Ref Range   Group B strep by PCR NEGATIVE  NEGATIVE  CBC WITH DIFFERENTIAL   Collection Time    01/10/14 10:01 AM      Result Value Ref Range   WBC 10.0  4.0 - 10.5 K/uL   RBC 4.05  3.87 - 5.11 MIL/uL   Hemoglobin 12.3  12.0 - 15.0 g/dL   HCT 40.936.0  81.136.0 - 91.446.0 %   MCV 88.9  78.0 - 100.0 fL   MCH 30.4  26.0 - 34.0 pg   MCHC 34.2  30.0 - 36.0 g/dL   RDW 78.213.7  95.611.5 - 21.315.5 %   Platelets 158  150 - 400 K/uL   Neutrophils Relative % 73  43 - 77 %   Neutro Abs 7.3  1.7 - 7.7 K/uL   Lymphocytes Relative 21  12 - 46 %   Lymphs Abs 2.1  0.7 - 4.0 K/uL   Monocytes Relative 5  3 - 12 %   Monocytes Absolute 0.5  0.1 - 1.0 K/uL   Eosinophils Relative 1  0 - 5 %   Eosinophils Absolute 0.1  0.0 - 0.7 K/uL   Basophils Relative 0  0 - 1 %   Basophils Absolute 0.0  0.0 - 0.1 K/uL  TYPE AND SCREEN   Collection Time    01/10/14 10:01 AM      Result Value Ref Range   ABO/RH(D) B POS     Antibody Screen NEG     Sample Expiration 01/13/2014      Assessment: Penny Hale is a 28 y.o. [id number] at 8330w2d here for active labor at term   #Labor: expectant management  #Pain: Epidural prn #polysubstance abuse: UDS and SW consult. Pt already warned of SW consult #FWB: Cat II tracing, EFW 6.5lbs #ID:  GBS PCR neg #MOF: bottle  #MOC: desired BTL but papers were not signed.   Shawntrice Salle L 01/10/2014, 11:58 AM

## 2014-01-10 NOTE — Progress Notes (Signed)
01/10/14 1200  Clinical Encounter Type  Visited With Patient  Visit Type Follow-up;Spiritual support;Social support  Spiritual Encounters  Spiritual Needs Emotional   Used authorization code to help Herbert SetaHeather make long-distance calls to support people in [place].  She is expecting her cousin Rodney Boozeasha ("a mama, sister, best friend," with whom Herbert SetaHeather lived as a child) for support this afternoon.  Per pt, [place] lives in [place] and [place] traveling from [place].  Tomorrow Herbert SetaHeather is expecting her other support people [place] from [place]  (ca[place] one hour away):  Diane and Buddy, who are the aunt and uncle of Aniyia's son Jacob's father, and Fleet ContrasRachel, their daughter.  Gerilyn Pilgrim(Jacob is Christon's two-year-old.  Per pt, Jacob's father is in jail.  Gerilyn PilgrimJacob apparently stays with Sedalia Mutaiane and Elnita MaxwellBuddy, and, per pt, Herbert SetaHeather would like them to take this baby, too.)  Outside of this group, pt reports no family or friends.  Continuing to follow closely for spiritual and emotional support.  Please page as family arrives and/or delivery approaches.  Thank you.  [address]Chaplain Nakiea Metzner [place], [place] [phone]

## 2014-01-11 ENCOUNTER — Encounter: Payer: Self-pay | Admitting: *Deleted

## 2014-01-11 LAB — CBC
HEMATOCRIT: 32.2 % — AB (ref 36.0–46.0)
Hemoglobin: 10.7 g/dL — ABNORMAL LOW (ref 12.0–15.0)
MCH: 29.6 pg (ref 26.0–34.0)
MCHC: 33.2 g/dL (ref 30.0–36.0)
MCV: 89.2 fL (ref 78.0–100.0)
Platelets: 138 10*3/uL — ABNORMAL LOW (ref 150–400)
RBC: 3.61 MIL/uL — AB (ref 3.87–5.11)
RDW: 13.9 % (ref 11.5–15.5)
WBC: 7.7 10*3/uL (ref 4.0–10.5)

## 2014-01-11 NOTE — Progress Notes (Signed)
Called Penny Hale, Resident on call to see if pt was going for a BTL in a.m because epidural catheter was still in. Told patient was not going for BTL in a.m and gave the OK for L&D nurse to come pull epidural catheter.

## 2014-01-11 NOTE — Progress Notes (Signed)
UR chart review completed.  

## 2014-01-11 NOTE — Progress Notes (Signed)
Provided follow up support to [facility].  She expressed great appreciation for her whole team who assisted and supported her during labor, especially Chaplain Rush BarerLisa Lundeen.  She is anxious to meet with the social worker to talk about the details of the adoption.  She asked for prayer for her son and we shared prayer together.  She is treasuring her time with him and is hopeful that the adoptive parents will be a good situation for her child.  [employer] CorporationChaplain Katy Takiya Belmares Pager, [phone] 3:18 PM   01/11/14 1500  Clinical Encounter Type  Visited With Patient  Visit Type Follow-up  Spiritual Encounters  Spiritual Needs Emotional

## 2014-01-11 NOTE — Anesthesia Postprocedure Evaluation (Signed)
  Anesthesia Post-op Note  Patient: Penny KennedyHeather C Coger  Procedure(s) Performed: * No procedures listed *  Patient Location: Mother/Baby  Anesthesia Type:Epidural  Level of Consciousness: awake  Airway and Oxygen Therapy: Patient Spontanous Breathing  Post-op Pain: mild  Post-op Assessment: Patient's Cardiovascular Status Stable and Respiratory Function Stable  Post-op Vital Signs: stable  Last Vitals:  Filed Vitals:   01/11/14 0530  BP: 112/77  Pulse: 86  Temp: 36.7 C  Resp: 18    Complications: No apparent anesthesia complications

## 2014-01-11 NOTE — Progress Notes (Signed)
I have seen and examined this patient and I agree with the above. Cam HaiSHAW, KIMBERLY CNM 8:49 AM 01/11/2014

## 2014-01-11 NOTE — Progress Notes (Signed)
Post Partum Day 1 Subjective: no complaints, up ad lib, voiding, tolerating PO, + flatus and Pt. with improved cramping, and tapered bleeding this morning. Desires to sign papers for BTL. Ok with Nexplenon insertion for contraception as bridge to [employer]BTL. Pt. with no other complaints this am.   Objective: Blood pressure 112/77, pulse 86, temperature 98.1 F (36.7 C), temperature source Oral, resp. rate 18, height 5\' 5"  (1.651 m), weight 73.483 kg (162 lb), SpO2 98.00%, unknown if currently breastfeeding.  Physical Exam:  General: alert, cooperative and no distress Lochia: appropriate Uterine Fundus: firm Incision: N/A DVT Evaluation: No evidence of DVT seen on physical exam. No cords or calf tenderness.   Recent Labs  01/10/14 1001 01/11/14 0610  HGB 12.3 10.7*  HCT 36.0 32.2*    Assessment/Plan: Plan for discharge tomorrow, Social Work consult and Contraception Nexplenon bridge to BTL   LOS: 1 day   Zafirah Vanzee G 01/11/2014, 7:10 AM

## 2014-01-12 MED ORDER — IBUPROFEN 600 MG PO TABS: 600.0000 mg | ORAL_TABLET | Freq: Four times a day (QID) | ORAL | Status: DC

## 2014-01-12 MED ORDER — MEDROXYPROGESTERONE ACETATE 150 MG/ML IM SUSP
150.0000 mg | Freq: Once | INTRAMUSCULAR | Status: AC
  Administered 2014-01-12: 150 mg via INTRAMUSCULAR
  Filled 2014-01-12: qty 1

## 2014-01-12 NOTE — Progress Notes (Signed)
CSW met with MOB to offer support and continue to discuss her options and thoughts on plans for baby.  MOB states she does not think she has anyone who is willing to care for [facility] temporarily.  She has asked Buddy and Diane and they state they cannot emotionally handle a temporary placement and MOB respects this.  She states her cousin Aniceto Boss is praying about it.  She states she has not yet asked her friend Apolonio Schneiders.  MOB states Jaylen's NAS scores have increased and she is understanding about the possibility that he may need care in the NICU.  She states her doctor has told her that she is discharged today and must leave.  She states he told her that she might be able to stay in a rooming in room if he goes to NICU.  CSW clarified with MOB that she may stay with baby in the MB room as long as baby is a MB patient, even after MOB is discharged, but that she cannot take him to the nursery/leave her room unless another adult is in the room with him.  CSW states that if he is transferred to NICU, MOB will no longer be able to stay in her MB room and she will not be able to stay in a rooming in room because they are for parents whose babies are discharging from NICU only.  She stating understanding of this.  She stated concern that she will not have a ride home if baby is transferred to NICU in the middle of the night.  CSW assured her that she will not lose her room if baby is transferred to NICU in the middle of the night, but that she will have to leave her room in the morning in this case.  MOB states she received a call from [facility] worker/M. McClary who will be meeting with her on 01/13/14 am.  CSW also spoke with Jerilynn Mages. McClary to confirm this.  MOB continues to appreciate support from [place] and Chaplains and continues to have difficulty with processing and decision making.  CSW felt MOB needed to know that she also has an option of making a legal adoption plan with an agency.  CSW hesitated since MOB is already overwhelmed  with her choices, but feels she needs to make an informed decision.  CSW explained this and asked MOB to ask questions if she needs more information.  MOB was appreciative.  CSW to continue to follow closely and offer support.

## 2014-01-12 NOTE — Progress Notes (Signed)
Clinical Social Work Department PSYCHOSOCIAL ASSESSMENT - MATERNAL/CHILD 01/11/2014  Patient:  Penny Hale, Penny Hale  Account Number:  [id number]  Admit Date:  01/10/2014  Penny Hale    Clinical Social Worker:  Terri Piedra, LCSW   Date/Time:  01/11/2014 04:00 PM  Date Referred:  01/11/2014   Referral source  CN  Physician     Referred reason  Substance Abuse   Other referral source:    I:  FAMILY / HOME ENVIRONMENT Child's legal guardian:  PARENT  Guardian - Name Guardian - Age Guardian - Address  Penny Hale 28 [address] [place], [address]  MOB does not know who the FOB is     Other household support members/support persons Name Relationship DOB  Penny Hale ROOMMATE    Other support:   MOB has a very limited support system.  She talks about a cousin named Penny Hale and a friend named Penny Hale, Penny Hale.  She also states there is a couple named Penny Hale and Penny Hale who are willing to take custody of the baby.    II  PSYCHOSOCIAL DATA Information Source:  Patient Interview  Occupational hygienist Employment:   Financial resources:  [employer] If Medicaid - County:  [employer] / Grade:   Maternity Care Coordinator / Child Hale Coordination / Early Interventions:  Cultural issues impacting care:   None stated    III  STRENGTHS Strengths  Other - See comment   Strength comment:  Patient has a good level of self awareness.  She is not in denial about her substance abuse and she understands that she is not capable of caring for an infant at this point in her life.   IV  RISK FACTORS AND CURRENT PROBLEMS Current Problem:  YES   Risk Factor & Current Problem Patient Issue Family Issue Risk Factor / Current Problem Comment  Substance Abuse Y N Daily polysubstance use  Mental Illness Y N Bipolar   N N     V  SOCIAL WORK ASSESSMENT  CSW met with MOB in her first floor room/110 to complete assessment due to polysubstance abuse.  MOB also  has a diagnosis of Bipolar disorder.  CSW is familiar with MOB from her admission to Antenatal at [redacted] weeks gestation.  MOB remembers CSW and states she is glad to see CSW as she knows she must talk about plans for baby.  She is very aware of her addiction and realizes that she is not capable of caring for her infant due to her drug use, unstable housing and lack of financial resources.  CSW had arranged for MOB to enter an inpatient substance abuse rehab facility, [facility], after meeting MOB in April 2015.  MOB seemed ashamed now and knew that CSW would ask her what happened/why she did not go to treatment.  MOB stated she "freaked out" and "got scared."  She states the intake counselor asked her how she would provide for herself and for her son (88 year old Penny Hale, of whom she still has custody, but lives with his aunt).  MOB states she did not know how to answer this.  She states she has no one who believes in her and no one who supports her.  She states she does not believe in herself.  CSW provided supportive counseling and attempted to empower MOB.  CSW validated her feelings and assured her that CSW has not given up on her even though she did not go to treatment.  This has, however, made it more difficult for MOB at this time because he options are limited.  She will not be able to take baby with her from the hospital since she is still using drugs daily and has unstable housing.  She states she has arranged for her friends Penny Hale and Penny Hale to take the baby.  She states they do not have the money to do a legal adoption so she is just going to "give" the baby to them.  CSW explained that CSW cannot allow her to do this.  CSW will have to either get CPS involved to approve this placement, or the family will have to have a home study completed and a lawyer to process legal adoption paperwork.  CSW asked MOB how she feels about her plans to allow the [facility] to raise her child and if she feels they  will be approved by CPS.  MOB began to cry.  She states, "in a perfect world, I don't do drugs and I get to take my baby home with me."  CSW explained that MOB has the option of working a plan with CPS, but that she has to be honest with herself if this is the choice she makes.  CSW commended MOB for how realistic she is and how she is not living in denial.  CSW encouraged her to think about how difficult it will be to get clean, find suitable housing, and get a job in order to provide for herself and her son in an appropriate way in order to be a mother to Penny Hale.  CSW asked if she can commit to getting treatment and getting clean.  She states she wants to but she doesn't know.  CSW appreciates her honesty.  MOB knows she has no one supporting her emotionally or financially.  CSW discussed with MOB that loving her baby is getting clean for herself and for him.  CSW also discussed that loving her baby is deciding to allow another couple to take him home from the hospital and provide the life he deserves right from the beginning.  MOB is understandably overwhelmed, but understands her options.  She asks appropriate questions, but can't seem to process or make decisions.  MOB admits to using crack on a daily basis.  She states using pain pills , Xanax and marijuana when she has it.  She understands hospital drug screen policy and need for CSW to make report to Child Protective Hale.  She is very Patent attorney for support from [place] and Chaplains.  She states nurses at her doctors offices are her "friends" too and these people have been her only visitors.  CSW spend nearly 2 hours with MOB offering support and discussing how to make an appropriate plan for herself and her baby.  CSW recommends counseling and drug treatment regardless of whether or not she chooses to work a plan for reunification with baby.  MOB understands and agrees.  CSW asked her to think about the things we discussed and to talk with Penny Hale about the possibility that any of them would be willing to be a temporary placement for Jaylen if MOB decides she wants to work a case plan with CPS.  She understands that even if they agree, CPS must approve them.  CSW will follow up in the morning.  MOB thanked CSW.   VI SOCIAL WORK PLAN Social Work Plan  Psychosocial Support/Ongoing Assessment of Needs  Patient/Family Education  Child  Protective Hale Report   Type of pt/family education:   Hospital drug screen policy/Child Protective Hale involvement  Discussion of her possible options since taking baby home from the hospital is not an option  [place]   If child protective Hale report - county:  [facility] If child protective Hale report - date:  01/12/2014 Information/referral to community resources comment:   CSW will discuss substance abuse treatment options with MOB, however, the focus of our conversation at this time has been the plans for her baby.  CPS/Tx worker may have to assist MOB with treatment plans.  CSW/CPS to assist with referral to mental health Hale.   Other social work plan:   CSW to monitor baby's drug screen results.  MOB was positive for Opiates, Cocaine, Benzodiazepines and THC on admission.

## 2014-01-12 NOTE — Discharge Summary (Signed)
Obstetric Discharge Summary Reason for Admission: onset of labor Prenatal Procedures: NST Intrapartum Procedures: spontaneous vaginal delivery Postpartum Procedures: none Complications-Operative and Postpartum: none Hemoglobin  Date Value Ref Range Status  01/11/2014 10.7* 12.0 - 15.0 g/dL Final     HCT  Date Value Ref Range Status  01/11/2014 32.2* 36.0 - 46.0 % Final  Hospital Course: Pt. Was admitted for onset of active labor. Prenatal course complicated by history of polysubstance abuse. She was admitted to L&D with progression to NSVD without complications. Baby boy was delivered without incident, and patient has had an uncomplicated postpartum course. She is ambulating, tolerating po, pain minimal, and bleeding tapered. She is stable and ready for discharge.   Delivery Note  At 2:23 PM a viable female was delivered via (Presentation ROA). APGAR: 8 ,9 ; weight pending.  Placenta status: Intact, Spontaneous. Cord: 3 vessels with the following complications: none .  Anesthesia: epidural  Episiotomy: none  Lacerations: none  Suture Repair: None  Est. Blood Loss (mL): 350cc  Mom to postpartum. Baby to Couplet care / Skin to Skin.  Called to delivery. Mother pushed over 10 minutes. [facility]. Cord clamped and cut. Active management of 3rd stage with traction and Pitocin. Placenta delivered intact with 3v cord. No tears. EBL 350cc. Counts correct. Hemostatic.  Melancon, Hillery HunterCaleb G  01/10/2014, 2:46 PM  I was present for and supervised the delivery of this newborn. I agree with above except that a nuchal x1 was reduced on the perineum and baby was delivered through a body cord. Mom requested baby prior to us leaving. SW to be consulted for polysubstance abuse.  BECK, KELI L, MD     Physical Exam:  General: alert, cooperative and no distress Lochia: appropriate Uterine Fundus: firm Incision: N/A DVT Evaluation: No evidence of DVT seen on physical exam. No cords or  calf tenderness.  Discharge Diagnoses: Term Pregnancy-delivered  Discharge Information: Date: 01/12/2014 Activity: unrestricted and pelvic rest Diet: routine Medications: PNV and Ibuprofen Condition: stable Instructions: refer to practice specific booklet Discharge to: home Follow-up Information   [facility] 2 weeks. (for tubal preoperative visit)    Contact information:   Karle Starch[address] [place] [id number] [phone]      Newborn Data: Live born female  Birth Weight: 5 lb 4 oz (2381 g) APGAR: 9, 9  Home with Unable to return home due to complicated social situation. Likely CPS  Melancon, Caleb G 01/12/2014, 9:25 AM  [place] in [place] for BTL pp visit and BTL I spoke with and examined patient and agree with resident's note and plan of care.  Tawana ScaleMichael Ryan Mauria Asquith, MD OB Fellow 01/12/2014 9:25 AM
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
## 2014-01-12 NOTE — Discharge Instructions (Signed)
Postpartum Care After Vaginal Delivery °After you deliver your newborn (postpartum period), the usual stay in the hospital is 24-72 hours. If there were problems with your labor or delivery, or if you have other medical problems, you might be in the hospital longer.  °While you are in the hospital, you will receive help and instructions on how to care for yourself and your newborn during the postpartum period.  °While you are in the hospital: °· Be sure to tell your nurses if you have pain or discomfort, as well as where you feel the pain and what makes the pain worse. °· If you had an incision made near your vagina (episiotomy) or if you had some tearing during delivery, the nurses may put ice packs on your episiotomy or tear. The ice packs may help to reduce the pain and swelling. °· If you are breastfeeding, you may feel uncomfortable contractions of your uterus for a couple of weeks. This is normal. The contractions help your uterus get back to normal size. °· It is normal to have some bleeding after delivery. °¨ For the first 1-3 days after delivery, the flow is red and the amount may be similar to a period. °¨ It is common for the flow to start and stop. °¨ In the first few days, you may pass some small clots. Let your nurses know if you begin to pass large clots or your flow increases. °¨ Do not  flush blood clots down the toilet before having the nurse look at them. °¨ During the next 3-10 days after delivery, your flow should become more watery and pink or brown-tinged in color. °¨ Ten to fourteen days after delivery, your flow should be a small amount of yellowish-white discharge. °¨ The amount of your flow will decrease over the first few weeks after delivery. Your flow may stop in 6-8 weeks. Most women have had their flow stop by 12 weeks after delivery. °· You should change your sanitary pads frequently. °· Wash your hands thoroughly with soap and water for at least 20 seconds after changing pads, using  the toilet, or before holding or feeding your newborn. °· You should feel like you need to empty your bladder within the first 6-8 hours after delivery. °· In case you become weak, lightheaded, or faint, call your nurse before you get out of bed for the first time and before you take a shower for the first time. °· Within the first few days after delivery, your breasts may begin to feel tender and full. This is called engorgement. Breast tenderness usually goes away within 48-72 hours after engorgement occurs. You may also notice milk leaking from your breasts. If you are not breastfeeding, do not stimulate your breasts. Breast stimulation can make your breasts produce more milk. °· Spending as much time as possible with your newborn is very important. During this time, you and your newborn can feel close and get to know each other. Having your newborn stay in your room (rooming in) will help to strengthen the bond with your newborn.  It will give you time to get to know your newborn and become comfortable caring for your newborn. °· Your hormones change after delivery. Sometimes the hormone changes can temporarily cause you to feel sad or tearful. These feelings should not last more than a few days. If these feelings last longer than that, you should talk to your caregiver. °· If desired, talk to your caregiver about methods of family planning or contraception. °·   Talk to your caregiver about immunizations. Your caregiver may want you to have the following immunizations before leaving the hospital:  Tetanus, diphtheria, and pertussis (Tdap) or tetanus and diphtheria (Td) immunization. It is very important that you and your family (including grandparents) or others caring for your newborn are up-to-date with the Tdap or Td immunizations. The Tdap or Td immunization can help protect your newborn from getting ill.  Rubella immunization.  Varicella (chickenpox) immunization.  Influenza immunization. You should  receive this annual immunization if you did not receive the immunization during your pregnancy. Document Released: 04/20/2007 Document Revised: 03/17/2012 Document Reviewed: 02/18/2012 [facility] Patient Information 2015 [employer] [place]. This information is not intended to replace advice given to you by your health care provider. Make sure you discuss any questions you have with your health care provider.  Your Preoperative / Follow Up appointment [facility]  [address]  [place], [id number]  Main: [phone] Your Appointment time is 09:15 am on 01/24/2014

## 2014-01-12 NOTE — Progress Notes (Signed)
01/12/14 1500  Clinical Encounter Type  Visited With Patient  Visit Type Follow-up;Spiritual support;Social support  Spiritual Encounters  Spiritual Needs Emotional   Followed up to offer further emotional support as Herbert SetaHeather navigates next steps for baby's care.  Helped her make phone call to support person.  She was working to take one step at at time, enjoying each moment with her baby.  Provided pastoral presence, empathic listening, and everyday conversation, for which pt is starved.  [place] will continue to follow for support throughout baby's stay.  Please page as needs arise.  Thank you.  [address]Chaplain Jerran Tappan [place], [place] [phone]

## 2014-01-13 ENCOUNTER — Ambulatory Visit: Payer: Self-pay

## 2014-01-13 NOTE — Lactation Note (Signed)
This note was copied from the chart of Boy Azalee CourseHeather Risse. Lactation Consultation Note  Patient Name: Boy Azalee CourseHeather Luna VHQIO'NToday's Date: 01/13/2014 Reason for consult: Initial assessment Asked by social worker what to do for Mom as she is becoming engorged. LC went to evaluate and Mom's breasts are firm with some nodules present. She does reports some discomfort. Cafeteria does not have cabbage leaves. Mom does not have bra here. Ice packs applied and advised Mom if she does not get some relief with ice packs she may need to pump some milk off to comfort and re-apply ice. Mom will call RN for assist if needs to pump or gets no relief.   Maternal Data    Feeding Feeding Type: Bottle Fed - Formula Nipple Type: Slow - flow  LATCH Score/Interventions                      Lactation Tools Discussed/Used     Consult Status Consult Status: Complete    Alfred LevinsGranger, Adriaan Maltese Ann 01/13/2014, 6:23 PM

## 2014-01-15 NOTE — Progress Notes (Signed)
Met with mother who stated that she plans to follow through with adoption plans.  Several representatives from [employer] met with mother and she signed the adoption paperwork.  Met with the representatives from [employer] and adoptive parents after mother signed the paperwork.  Copy of the relinquishment paperwork placed in newborn's chart.

## 2014-01-16 ENCOUNTER — Encounter: Payer: Self-pay | Admitting: *Deleted

## 2014-01-17 ENCOUNTER — Telehealth: Payer: Self-pay | Admitting: Obstetrics & Gynecology

## 2014-01-17 DIAGNOSIS — F419 Anxiety disorder, unspecified: Secondary | ICD-10-CM

## 2014-01-17 MED ORDER — HYDROXYZINE HCL 25 MG PO TABS: 25.0000 mg | ORAL_TABLET | Freq: Four times a day (QID) | ORAL | Status: DC | PRN

## 2014-01-24 ENCOUNTER — Encounter: Payer: MEDICAID | Admitting: Obstetrics & Gynecology

## 2014-01-31 ENCOUNTER — Encounter: Payer: MEDICAID | Admitting: Obstetrics and Gynecology

## 2014-02-02 ENCOUNTER — Encounter: Payer: Self-pay | Admitting: *Deleted

## 2014-02-14 ENCOUNTER — Ambulatory Visit: Payer: MEDICAID | Admitting: Obstetrics & Gynecology

## 2014-05-08 ENCOUNTER — Encounter (HOSPITAL_COMMUNITY): Payer: Self-pay | Admitting: *Deleted

## 2015-06-19 ENCOUNTER — Emergency Department (HOSPITAL_COMMUNITY)
Admission: EM | Admit: 2015-06-19 | Discharge: 2015-06-19 | Disposition: A | Discharge status: Home / Self Care | Payer: Medicaid Other | Attending: Emergency Medicine | Admitting: Emergency Medicine

## 2015-06-19 ENCOUNTER — Encounter (HOSPITAL_COMMUNITY): Payer: Self-pay | Admitting: Emergency Medicine

## 2015-06-19 DIAGNOSIS — B9689 Other specified bacterial agents as the cause of diseases classified elsewhere: Secondary | ICD-10-CM

## 2015-06-19 DIAGNOSIS — F17210 Nicotine dependence, cigarettes, uncomplicated: Secondary | ICD-10-CM | POA: Insufficient documentation

## 2015-06-19 DIAGNOSIS — Z8619 Personal history of other infectious and parasitic diseases: Secondary | ICD-10-CM | POA: Insufficient documentation

## 2015-06-19 DIAGNOSIS — N760 Acute vaginitis: Secondary | ICD-10-CM | POA: Insufficient documentation

## 2015-06-19 DIAGNOSIS — R300 Dysuria: Secondary | ICD-10-CM

## 2015-06-19 DIAGNOSIS — Z8659 Personal history of other mental and behavioral disorders: Secondary | ICD-10-CM | POA: Insufficient documentation

## 2015-06-19 DIAGNOSIS — Z3202 Encounter for pregnancy test, result negative: Secondary | ICD-10-CM | POA: Insufficient documentation

## 2015-06-19 DIAGNOSIS — Z79899 Other long term (current) drug therapy: Secondary | ICD-10-CM | POA: Insufficient documentation

## 2015-06-19 DIAGNOSIS — R109 Unspecified abdominal pain: Secondary | ICD-10-CM

## 2015-06-19 DIAGNOSIS — M545 Low back pain: Secondary | ICD-10-CM | POA: Insufficient documentation

## 2015-06-19 LAB — WET PREP, GENITAL
SPERM: NONE SEEN
Trich, Wet Prep: NONE SEEN
Yeast Wet Prep HPF POC: NONE SEEN

## 2015-06-19 LAB — URINALYSIS, ROUTINE W REFLEX MICROSCOPIC
Bilirubin Urine: NEGATIVE
GLUCOSE, UA: NEGATIVE mg/dL
HGB URINE DIPSTICK: NEGATIVE
Ketones, ur: NEGATIVE mg/dL
LEUKOCYTES UA: NEGATIVE
Nitrite: NEGATIVE
PH: 7 (ref 5.0–8.0)
PROTEIN: NEGATIVE mg/dL
Specific Gravity, Urine: 1.015 (ref 1.005–1.030)

## 2015-06-19 LAB — PREGNANCY, URINE: PREG TEST UR: NEGATIVE

## 2015-06-19 MED ORDER — CEPHALEXIN 500 MG PO CAPS
500.0000 mg | ORAL_CAPSULE | Freq: Once | ORAL | Status: AC
  Administered 2015-06-19: 500 mg via ORAL
  Filled 2015-06-19: qty 1

## 2015-06-19 MED ORDER — METRONIDAZOLE 500 MG PO TABS
500.0000 mg | ORAL_TABLET | Freq: Once | ORAL | Status: AC
  Administered 2015-06-19: 500 mg via ORAL
  Filled 2015-06-19: qty 1

## 2015-06-19 MED ORDER — CEFTRIAXONE SODIUM 250 MG IJ SOLR
250.0000 mg | Freq: Once | INTRAMUSCULAR | Status: AC
  Administered 2015-06-19: 250 mg via INTRAMUSCULAR
  Filled 2015-06-19: qty 250

## 2015-06-19 MED ORDER — AZITHROMYCIN 250 MG PO TABS
1000.0000 mg | ORAL_TABLET | Freq: Every day | ORAL | Status: DC
  Administered 2015-06-19: 1000 mg via ORAL
  Filled 2015-06-19: qty 4

## 2015-06-19 MED ORDER — LIDOCAINE HCL (PF) 1 % IJ SOLN
INTRAMUSCULAR | Status: AC
  Filled 2015-06-19: qty 5

## 2015-06-19 MED ORDER — CEPHALEXIN 500 MG PO CAPS: 500.0000 mg | ORAL_CAPSULE | Freq: Four times a day (QID) | ORAL | Status: DC

## 2015-06-19 MED ORDER — METRONIDAZOLE 500 MG PO TABS: 500.0000 mg | ORAL_TABLET | Freq: Two times a day (BID) | ORAL | Status: DC

## 2015-06-19 NOTE — ED Notes (Signed)
Pt states that her low back is hurting with urinary frequency and dysuria.  Started 4 days ago.

## 2015-06-19 NOTE — ED Provider Notes (Addendum)
CSN: [id number]     Arrival date & time 06/19/15  1553 History   First MD Initiated Contact with Patient 06/19/15 1600     Chief Complaint  Patient presents with  . Dysuria   HPI  29 y.o. female with  has a past medical history of ADHD (attention deficit hyperactivity disorder); Bipolar 1 disorder (HCC); Manic-depressive (HCC); Polysubstance abuse; and Trichomonal vaginitis., presents to the Emergency Department today complaining of dysuria. States that the pain began 4 days ago when she was going to the bathroom. Notes that the urine smells and looks dark. Has Hx of UTIs. Hx of Kidney infection while pregnant last year and treated inpt with abx. Pain is reported at a 5/10. Has taken ibuprofen with minimal relief. Denies fever, N/V, hematuria. Reports mild back pain that started with the dysuria.    Past Medical History  Diagnosis Date  . ADHD (attention deficit hyperactivity disorder)   . Bipolar 1 disorder (HCC)   . Manic-depressive (HCC)   . Polysubstance abuse     cocaine, opiates, benzos, marijuana  . Trichomonal vaginitis    Past Surgical History  Procedure Laterality Date  . Dilation and curettage of uterus     Family History  Problem Relation Age of Onset  . COPD Mother    Social History  Substance Use Topics  . Smoking status: Current Every Day Smoker -- 1.00 packs/day for 12 years    Types: Cigarettes  . Smokeless tobacco: Never Used  . Alcohol Use: No   OB History    Gravida Para Term Preterm AB TAB SAB Ectopic Multiple Living   7 5 5  2  2   5      Review of Systems  Constitutional: Negative for fever.  Respiratory: Negative for cough and shortness of breath.   Gastrointestinal: Negative for nausea, vomiting, constipation and blood in stool.  Genitourinary: Positive for dysuria and flank pain. Negative for hematuria, vaginal bleeding, vaginal discharge, difficulty urinating, vaginal pain and pelvic pain.  Musculoskeletal: Positive for back pain.  Neurological:  Negative for headaches.   Allergies  Review of patient's allergies indicates no known allergies.  Home Medications   Prior to Admission medications   Medication Sig Start Date End Date Taking? Authorizing Provider  acetaminophen (TYLENOL) 500 MG tablet Take 2,000 mg by mouth every 3 (three) hours as needed for moderate pain.     Historical Provider, MD  hydrOXYzine (ATARAX/VISTARIL) 25 MG tablet Take 1 tablet (25 mg total) by mouth every 6 (six) hours as needed for anxiety. 01/17/14   Adam PhenixJames G Arnold, MD  ibuprofen (ADVIL,MOTRIN) 600 MG tablet Take 1 tablet (600 mg total) by mouth every 6 (six) hours. 01/12/14   Hillery Hunteraleb G Melancon, MD  pantoprazole (PROTONIX) 40 MG tablet Take 1 tablet (40 mg total) by mouth daily. 12/20/13 12/20/14  Adam PhenixJames G Arnold, MD  Prenatal Vit-Fe Fumarate-FA (PRENATAL MULTIVITAMIN) TABS tablet Take 1 tablet by mouth daily at 12 noon. 11/29/13   Adam PhenixJames G Arnold, MD  traMADol (ULTRAM) 50 MG tablet Take 50 mg by mouth every 6 (six) hours as needed for moderate pain.    Historical Provider, MD   BP 139/83 mmHg  Pulse 87  Temp(Src) 98.1 F (36.7 C) (Oral)  Resp 16  Ht 5\' 4"  (1.626 m)  Wt 87.091 kg  BMI 32.94 kg/m2  SpO2 100%  LMP  (LMP Unknown) Physical Exam  Constitutional: She is oriented to person, place, and time. She appears well-developed and well-nourished.  HENT:  Head: Normocephalic and atraumatic.  Cardiovascular: Normal rate.   Pulmonary/Chest: Effort normal and breath sounds normal.  Abdominal: Soft. She exhibits no distension. There is tenderness (RLQ) in the right lower quadrant, suprapubic area and left lower quadrant. There is no CVA tenderness.  Genitourinary: Pelvic exam was performed with patient supine. Cervix exhibits discharge (White discharge.). Cervix exhibits no motion tenderness. Right adnexum displays no mass and no tenderness. Left adnexum displays no mass. No tenderness or bleeding in the vagina.  Neurological: She is alert and oriented to  person, place, and time.  Skin: Skin is warm.  Psychiatric: She has a normal mood and affect. Her behavior is normal.   *A chaperone was present during the Pelvic Examination.   ED Course  Procedures (including critical care time) Labs Review Labs Reviewed  WET PREP, GENITAL - Abnormal; Notable for the following:    Clue Cells Wet Prep HPF POC PRESENT (*)    WBC, Wet Prep HPF POC MANY (*)    All other components within normal limits  PREGNANCY, URINE  URINALYSIS, ROUTINE W REFLEX MICROSCOPIC (NOT AT [facility])  GC/CHLAMYDIA PROBE AMP ([place]) NOT AT [facility]   Imaging Review No results found. I have personally reviewed and evaluated these images and lab results as part of my medical decision-making.   EKG Interpretation None     MDM  I have reviewed relevant laboratory values. I obtained HPI from historian. Cases discussed with Attending Physician  ED Course: 5:13 PM- Given Keflex PO due to symptoms consistent with UTI 6:01 PM- Pelvic exam with GC/Wet prep  Assessment: 29y F that presents with dysuria x4 days. No fever, n/v. Neg CVA tenderness. Suspicion low for pyelonephritis due to above exam. Does not meet SIRS criteria. Pelvic exam with white discharge throughout. Positive wet prep.      Disposition/Plan:  Given prophylactic Azithro and Rocephin in ED  Sent home with Flagyl PO D/C with follow up instructions     Patient was discussed with Eber Hong, MD    Final diagnoses:  None      Audry Pili, PA-C 06/19/15 [id number]  Eber Hong, MD 06/20/15 1649  Audry Pili, PA-C 06/20/15 1717  Eber Hong, MD 06/20/15 1756

## 2015-06-19 NOTE — ED Notes (Signed)
PA at bedside.

## 2015-06-19 NOTE — ED Provider Notes (Signed)
The patient is a 29 year old female, she reports frequent history of urinary tract infections over the years, this is the first one the last 12 months, she reports several days of dysuria, abdominal cramping, urinary frequency and foul-smelling urine. She also reports some left lower back pain but no fevers nausea or vomiting. On exam she is mildly obese, has a soft minimally tender abdomen in the suprapubic and left lower quadrant, minimal CVA tenderness, no fever, no tachycardia, clear heart and lung sounds and appears in no distress. Urinalysis, urine pregnancy, treated with antibiotics, no indication for culture as this does appear to be a simple urinary tract infection, doubt pyelonephritis.  UA without findings - see pelvic from APP note - tx for STI - stable for d/c  Medical screening examination/treatment/procedure(s) were conducted as a shared visit with non-physician practitioner(s) and myself.  I personally evaluated the patient during the encounter.  Clinical Impression:   Final diagnoses:  Bacterial vaginosis  Dysuria  Flank pain         Eber HongBrian Jermani Pund, MD 06/20/15 [phone]

## 2015-06-19 NOTE — Discharge Instructions (Signed)
Please read and follow all provided instructions.  Your diagnoses today include:  1. Bacterial vaginosis   2. Dysuria   3. Flank pain     Tests performed today include:  Vital signs. See below for your results today.   Medications prescribed:   None  Home care instructions:  Follow any educational materials contained in this packet.  Follow-up instructions: Please follow-up with your primary care provider as needed for further evaluation of your symptoms.  Return instructions:   Please return to the Emergency Department if you experience worsening symptoms OR  - Fever (temperature greater than 101.67F)  - Bleeding that does not stop with holding pressure to the area    -Severe pain (please note that you may be more sore the day after your accident)  - Chest Pain  - Difficulty breathing  - Severe nausea or vomiting  - Inability to tolerate food and liquids  - Passing out  - Skin becoming red around your wounds  - Change in mental status (confusion or lethargy)  - New numbness or weakness     Please return if you have any other emergent concerns.  Additional Information:  Your vital signs today were: BP 139/83 mmHg   Pulse 87   Temp(Src) 98.1 F (36.7 C) (Oral)   Resp 16   Ht 5\' 4"  (1.626 m)   Wt 87.091 kg   BMI 32.94 kg/m2   SpO2 100%   LMP  (LMP Unknown) If your blood pressure (BP) was elevated above 135/85 this visit, please have this repeated by your doctor within one month. ---------------

## 2015-06-20 LAB — GC/CHLAMYDIA PROBE AMP (~~LOC~~) NOT AT ARMC
CHLAMYDIA, DNA PROBE: NEGATIVE
NEISSERIA GONORRHEA: NEGATIVE

## 2015-10-30 ENCOUNTER — Encounter (HOSPITAL_COMMUNITY): Payer: Self-pay | Admitting: Emergency Medicine

## 2015-10-30 ENCOUNTER — Emergency Department (HOSPITAL_COMMUNITY)
Admission: EM | Admit: 2015-10-30 | Discharge: 2015-10-30 | Disposition: A | Discharge status: Home / Self Care | Payer: Medicaid Other | Attending: Emergency Medicine | Admitting: Emergency Medicine

## 2015-10-30 DIAGNOSIS — F319 Bipolar disorder, unspecified: Secondary | ICD-10-CM | POA: Insufficient documentation

## 2015-10-30 DIAGNOSIS — K047 Periapical abscess without sinus: Secondary | ICD-10-CM | POA: Insufficient documentation

## 2015-10-30 DIAGNOSIS — F17210 Nicotine dependence, cigarettes, uncomplicated: Secondary | ICD-10-CM | POA: Insufficient documentation

## 2015-10-30 MED ORDER — AMOXICILLIN 250 MG PO CAPS
500.0000 mg | ORAL_CAPSULE | Freq: Once | ORAL | Status: AC
  Administered 2015-10-30: 500 mg via ORAL
  Filled 2015-10-30: qty 2

## 2015-10-30 MED ORDER — LIDOCAINE VISCOUS 2 % MT SOLN
15.0000 mL | Freq: Once | OROMUCOSAL | Status: AC
  Administered 2015-10-30: 15 mL via OROMUCOSAL
  Filled 2015-10-30: qty 15

## 2015-10-30 MED ORDER — KETOROLAC TROMETHAMINE 60 MG/2ML IM SOLN
60.0000 mg | Freq: Once | INTRAMUSCULAR | Status: AC
  Administered 2015-10-30: 60 mg via INTRAMUSCULAR
  Filled 2015-10-30: qty 2

## 2015-10-30 MED ORDER — IBUPROFEN 600 MG PO TABS: 600.0000 mg | ORAL_TABLET | Freq: Four times a day (QID) | ORAL | Status: DC | PRN

## 2015-10-30 MED ORDER — AMOXICILLIN 500 MG PO CAPS: 500.0000 mg | ORAL_CAPSULE | Freq: Three times a day (TID) | ORAL | Status: AC

## 2015-10-30 NOTE — Discharge Instructions (Signed)

## 2015-10-30 NOTE — ED Notes (Signed)
Pt c/o dental discomfort on the lower right jaw for x 9 days.  Dental caries noted in pts mouth.  Patient states she has been using ASA, and tylenol for pain with no relief.

## 2015-10-30 NOTE — ED Notes (Signed)
Pt made aware to return if symptoms worsen or if any life threatening symptoms occur.   

## 2015-10-30 NOTE — ED Notes (Signed)
Pt given viscous lidocaine application today in ED and sent home with instruction to use lidocains q2 hours as needed per Burgess AmorJulie Idol, PA. Pt verbalized understanding of d/c instructions

## 2015-10-30 NOTE — ED Provider Notes (Signed)
CSN: [id number]     Arrival date & time 10/30/15  1451 History   First MD Initiated Contact with Patient 10/30/15 1502     Chief Complaint  Patient presents with  . Dental Problem     (Consider location/radiation/quality/duration/timing/severity/associated sxs/prior Treatment) The history is provided by the patient.   STARR URIAS is a 30 y.o. female presenting with a 9 day history of dental pain and gingival swelling.   The patient has a history of injury and/or decay in the tooth involved which has recently started to cause increased  pain.  There has been no fevers, chills, nausea or vomiting, also no complaint of difficulty swallowing, although chewing makes pain worse.  She plans to obtain from the free clinic.  She has tried ibuprofen without relief of symptoms.        Past Medical History  Diagnosis Date  . ADHD (attention deficit hyperactivity disorder)   . Bipolar 1 disorder (HCC)   . Manic-depressive (HCC)   . Polysubstance abuse     cocaine, opiates, benzos, marijuana  . Trichomonal vaginitis    Past Surgical History  Procedure Laterality Date  . Dilation and curettage of uterus     Family History  Problem Relation Age of Onset  . COPD Mother    Social History  Substance Use Topics  . Smoking status: Current Every Day Smoker -- 1.00 packs/day for 12 years    Types: Cigarettes  . Smokeless tobacco: Never Used  . Alcohol Use: No   OB History    Gravida Para Term Preterm AB TAB SAB Ectopic Multiple Living   Review of Systems  Constitutional: Negative for fever.  HENT: Positive for dental problem. Negative for facial swelling and sore throat.   Respiratory: Negative for shortness of breath.   Musculoskeletal: Negative for neck pain and neck stiffness.      Allergies  Review of patient's allergies indicates no known allergies.  Home Medications   Prior to Admission medications   Medication Sig Start Date End Date Taking?  Authorizing Provider  amoxicillin (AMOXIL) 500 MG capsule Take 1 capsule (500 mg total) by mouth 3 (three) times daily. 10/30/15 11/09/15  Burgess Amor, PA-C  ibuprofen (ADVIL,MOTRIN) 600 MG tablet Take 1 tablet (600 mg total) by mouth every 6 (six) hours as needed. 10/30/15   Burgess Amor, PA-C  metroNIDAZOLE (FLAGYL) 500 MG tablet Take 1 tablet (500 mg total) by mouth 2 (two) times daily. 06/19/15   Audry Pili, PA-C   BP 142/93 mmHg  Pulse 92  Temp(Src) 98.1 F (36.7 C)  Resp 20  Ht  (1.651 m)  Wt 79.833 kg  BMI 29.29 kg/m2  SpO2 98%  LMP 10/07/2015 Physical Exam  Constitutional: She is oriented to person, place, and time. She appears well-developed and well-nourished. No distress.  HENT:  Head: Normocephalic and atraumatic.  Right Ear: Tympanic membrane and external ear normal.  Left Ear: Tympanic membrane and external ear normal.  Mouth/Throat: Oropharynx is clear and moist and mucous membranes are normal. No oral lesions. No trismus in the jaw. Dental abscesses present.  Indurated swelling along right mandible adjacent to the second premolar and first molar tooth.  There is no fluctuance and no drainage.  Patient has poor dentition.  The involved teeth are decayed to the gingival line.  Eyes: Conjunctivae are normal.  Neck: Normal range of motion. Neck supple.  Cardiovascular: Normal rate  and normal heart sounds.   Pulmonary/Chest: Effort normal.  Musculoskeletal: Normal range of motion.  Lymphadenopathy:    She has no cervical adenopathy.  Neurological: She is alert and oriented to person, place, and time.  Skin: Skin is warm and dry. No erythema.  Psychiatric: She has a normal mood and affect.    ED Course  Procedures (including critical care time) Labs Review Labs Reviewed - No data to display  Imaging Review No results found. I have personally reviewed and evaluated these images and lab results as part of my medical decision-making.   EKG Interpretation None       MDM   Final diagnoses:  Dental abscess    Patient was on amoxicillin.  She was given a dose of Toradol here from it relief.  Ibuprofen.  Follow as planned.    Burgess AmorJulie Shaquella Stamant, PA-C 10/30/15 1522  Vanetta MuldersScott Zackowski, MD 10/30/15 [phone]

## 2016-02-11 ENCOUNTER — Emergency Department (HOSPITAL_COMMUNITY)
Admission: EM | Admit: 2016-02-11 | Discharge: 2016-02-11 | Disposition: A | Discharge status: Other Acute Inpatient Hospital | Payer: Medicaid Other | Attending: Emergency Medicine | Admitting: Emergency Medicine

## 2016-02-11 ENCOUNTER — Encounter (HOSPITAL_COMMUNITY): Payer: Self-pay | Admitting: Emergency Medicine

## 2016-02-11 ENCOUNTER — Inpatient Hospital Stay (HOSPITAL_COMMUNITY)
Admission: AD | Admit: 2016-02-11 | Discharge: 2016-02-14 | DRG: 897 | Disposition: A | Discharge status: Home / Self Care | Payer: No Typology Code available for payment source | Source: Intra-hospital | Attending: Psychiatry | Admitting: Psychiatry

## 2016-02-11 DIAGNOSIS — R45851 Suicidal ideations: Secondary | ICD-10-CM | POA: Diagnosis present

## 2016-02-11 DIAGNOSIS — F1120 Opioid dependence, uncomplicated: Secondary | ICD-10-CM | POA: Diagnosis present

## 2016-02-11 DIAGNOSIS — Z79899 Other long term (current) drug therapy: Secondary | ICD-10-CM | POA: Insufficient documentation

## 2016-02-11 DIAGNOSIS — F419 Anxiety disorder, unspecified: Secondary | ICD-10-CM | POA: Diagnosis present

## 2016-02-11 DIAGNOSIS — F17210 Nicotine dependence, cigarettes, uncomplicated: Secondary | ICD-10-CM | POA: Insufficient documentation

## 2016-02-11 DIAGNOSIS — Z62810 Personal history of physical and sexual abuse in childhood: Secondary | ICD-10-CM | POA: Diagnosis present

## 2016-02-11 DIAGNOSIS — E876 Hypokalemia: Secondary | ICD-10-CM

## 2016-02-11 DIAGNOSIS — F1414 Cocaine abuse with cocaine-induced mood disorder: Principal | ICD-10-CM | POA: Diagnosis present

## 2016-02-11 DIAGNOSIS — F1490 Cocaine use, unspecified, uncomplicated: Secondary | ICD-10-CM | POA: Insufficient documentation

## 2016-02-11 DIAGNOSIS — F1920 Other psychoactive substance dependence, uncomplicated: Secondary | ICD-10-CM | POA: Diagnosis present

## 2016-02-11 DIAGNOSIS — K219 Gastro-esophageal reflux disease without esophagitis: Secondary | ICD-10-CM | POA: Diagnosis present

## 2016-02-11 DIAGNOSIS — F1124 Opioid dependence with opioid-induced mood disorder: Secondary | ICD-10-CM | POA: Diagnosis present

## 2016-02-11 DIAGNOSIS — G4700 Insomnia, unspecified: Secondary | ICD-10-CM | POA: Diagnosis present

## 2016-02-11 DIAGNOSIS — F19929 Other psychoactive substance use, unspecified with intoxication, unspecified: Secondary | ICD-10-CM

## 2016-02-11 DIAGNOSIS — Z825 Family history of asthma and other chronic lower respiratory diseases: Secondary | ICD-10-CM | POA: Diagnosis not present

## 2016-02-11 DIAGNOSIS — F1290 Cannabis use, unspecified, uncomplicated: Secondary | ICD-10-CM | POA: Diagnosis present

## 2016-02-11 DIAGNOSIS — F314 Bipolar disorder, current episode depressed, severe, without psychotic features: Secondary | ICD-10-CM | POA: Diagnosis present

## 2016-02-11 DIAGNOSIS — F3289 Other specified depressive episodes: Secondary | ICD-10-CM

## 2016-02-11 DIAGNOSIS — F1994 Other psychoactive substance use, unspecified with psychoactive substance-induced mood disorder: Secondary | ICD-10-CM | POA: Clinically undetermined

## 2016-02-11 DIAGNOSIS — F909 Attention-deficit hyperactivity disorder, unspecified type: Secondary | ICD-10-CM | POA: Diagnosis present

## 2016-02-11 DIAGNOSIS — F1910 Other psychoactive substance abuse, uncomplicated: Secondary | ICD-10-CM

## 2016-02-11 DIAGNOSIS — F1410 Cocaine abuse, uncomplicated: Secondary | ICD-10-CM | POA: Diagnosis present

## 2016-02-11 DIAGNOSIS — Z331 Pregnant state, incidental: Secondary | ICD-10-CM

## 2016-02-11 DIAGNOSIS — F1310 Sedative, hypnotic or anxiolytic abuse, uncomplicated: Secondary | ICD-10-CM | POA: Diagnosis present

## 2016-02-11 LAB — RAPID URINE DRUG SCREEN, HOSP PERFORMED
AMPHETAMINES: NOT DETECTED
BARBITURATES: NOT DETECTED
BENZODIAZEPINES: POSITIVE — AB
Cocaine: POSITIVE — AB
Opiates: POSITIVE — AB
TETRAHYDROCANNABINOL: POSITIVE — AB

## 2016-02-11 LAB — CBC
HCT: 43.6 % (ref 36.0–46.0)
HEMOGLOBIN: 15.2 g/dL — AB (ref 12.0–15.0)
MCH: 31.7 pg (ref 26.0–34.0)
MCHC: 34.9 g/dL (ref 30.0–36.0)
MCV: 90.8 fL (ref 78.0–100.0)
Platelets: 273 10*3/uL (ref 150–400)
RBC: 4.8 MIL/uL (ref 3.87–5.11)
RDW: 13.9 % (ref 11.5–15.5)
WBC: 7.9 10*3/uL (ref 4.0–10.5)

## 2016-02-11 LAB — COMPREHENSIVE METABOLIC PANEL
ALT: 22 U/L (ref 14–54)
AST: 22 U/L (ref 15–41)
Albumin: 4.2 g/dL (ref 3.5–5.0)
Alkaline Phosphatase: 84 U/L (ref 38–126)
Anion gap: 8 (ref 5–15)
BILIRUBIN TOTAL: 0.9 mg/dL (ref 0.3–1.2)
BUN: 9 mg/dL (ref 6–20)
CHLORIDE: 102 mmol/L (ref 101–111)
CO2: 26 mmol/L (ref 22–32)
Calcium: 9.2 mg/dL (ref 8.9–10.3)
Creatinine, Ser: 0.71 mg/dL (ref 0.44–1.00)
GFR calc Af Amer: >60 mL/min (ref >60)
GFR calc non Af Amer: >60 mL/min (ref >60)
Glucose, Bld: 78 mg/dL (ref 65–99)
Potassium: 3 mmol/L — ABNORMAL LOW (ref 3.5–5.1)
Sodium: 136 mmol/L (ref 135–145)
TOTAL PROTEIN: 7.7 g/dL (ref 6.5–8.1)

## 2016-02-11 LAB — URINALYSIS, ROUTINE W REFLEX MICROSCOPIC
GLUCOSE, UA: NEGATIVE mg/dL
HGB URINE DIPSTICK: NEGATIVE
Ketones, ur: NEGATIVE mg/dL
Leukocytes, UA: NEGATIVE
Nitrite: NEGATIVE
PROTEIN: NEGATIVE mg/dL
Specific Gravity, Urine: 1.033 — ABNORMAL HIGH (ref 1.005–1.030)
pH: 6 (ref 5.0–8.0)

## 2016-02-11 LAB — I-STAT BETA HCG BLOOD, ED (MC, WL, AP ONLY): HCG, QUANTITATIVE: 81.3 m[IU]/mL — AB (ref <5)

## 2016-02-11 LAB — ACETAMINOPHEN LEVEL: Acetaminophen (Tylenol), Serum: <10 ug/mL — ABNORMAL LOW (ref 10–30)

## 2016-02-11 LAB — SALICYLATE LEVEL: Salicylate Lvl: <4 mg/dL (ref 2.8–30.0)

## 2016-02-11 LAB — ETHANOL: Alcohol, Ethyl (B): <5 mg/dL (ref <5)

## 2016-02-11 MED ORDER — HYDROXYZINE HCL 50 MG PO TABS
50.0000 mg | ORAL_TABLET | Freq: Every evening | ORAL | Status: DC | PRN
  Administered 2016-02-11: 50 mg via ORAL
  Filled 2016-02-11: qty 1

## 2016-02-11 MED ORDER — ACETAMINOPHEN 325 MG PO TABS: 650.0000 mg | ORAL_TABLET | ORAL | Status: DC | PRN

## 2016-02-11 MED ORDER — ALUM & MAG HYDROXIDE-SIMETH 200-200-20 MG/5ML PO SUSP: 30.0000 mL | ORAL | Status: DC | PRN

## 2016-02-11 MED ORDER — HYDROXYZINE HCL 25 MG PO TABS
25.0000 mg | ORAL_TABLET | Freq: Four times a day (QID) | ORAL | Status: DC | PRN
  Administered 2016-02-12: 25 mg via ORAL
  Filled 2016-02-11 (×2): qty 1

## 2016-02-11 MED ORDER — POTASSIUM CHLORIDE CRYS ER 20 MEQ PO TBCR
40.0000 meq | EXTENDED_RELEASE_TABLET | Freq: Once | ORAL | Status: AC
  Administered 2016-02-11: 40 meq via ORAL
  Filled 2016-02-11: qty 2

## 2016-02-11 MED ORDER — ACETAMINOPHEN 325 MG PO TABS
650.0000 mg | ORAL_TABLET | Freq: Four times a day (QID) | ORAL | Status: DC | PRN
  Administered 2016-02-11 – 2016-02-13 (×4): 650 mg via ORAL
  Filled 2016-02-11 (×4): qty 2

## 2016-02-11 MED ORDER — NICOTINE 21 MG/24HR TD PT24
21.0000 mg | MEDICATED_PATCH | Freq: Every day | TRANSDERMAL | Status: DC
  Administered 2016-02-11: 21 mg via TRANSDERMAL
  Filled 2016-02-11: qty 1

## 2016-02-11 MED ORDER — MAGNESIUM HYDROXIDE 400 MG/5ML PO SUSP: 30.0000 mL | Freq: Every day | ORAL | Status: DC | PRN

## 2016-02-11 MED ORDER — PENICILLIN V POTASSIUM 500 MG PO TABS
500.0000 mg | ORAL_TABLET | Freq: Four times a day (QID) | ORAL | Status: DC
  Administered 2016-02-11 (×2): 500 mg via ORAL
  Filled 2016-02-11 (×2): qty 1

## 2016-02-11 MED ORDER — PENICILLIN V POTASSIUM 500 MG PO TABS
500.0000 mg | ORAL_TABLET | Freq: Four times a day (QID) | ORAL | Status: AC
  Administered 2016-02-11 – 2016-02-13 (×5): 500 mg via ORAL
  Filled 2016-02-11 (×6): qty 1

## 2016-02-11 MED ORDER — ONDANSETRON HCL 4 MG PO TABS: 4.0000 mg | ORAL_TABLET | Freq: Three times a day (TID) | ORAL | Status: DC | PRN
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
## 2016-02-11 NOTE — ED Notes (Signed)
Bed: WBH35 Expected date:  Expected time:  Means of arrival:  Comments: TR 4 
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
## 2016-02-11 NOTE — BH Assessment (Signed)
[facility] Assessment Progress Note  Per Nanine MeansJamison Lord, DNP, this pt requires psychiatric hospitalization at this time.  Lillia AbedLindsay, RN, [facility].  Pt has signed Voluntary Admission and Consent for Treatment, as well as Consent to Release Information to a friend, and signed forms [facility].  Pt's nurse, Kendal Hymendie, has been notified, and agrees to send original paperwork along with pt via [facility], and to call report to [phone].  Doylene Canninghomas Flara Storti, MA Triage Specialist [phone]
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
## 2016-02-11 NOTE — ED Provider Notes (Signed)
[facility]-EMERGENCY DEPT Provider Note   CSN: [id number] Arrival date & time: 02/11/16  1042  First Provider Contact:  First MD Initiated Contact with Patient 02/11/16 1113        History   Chief Complaint Chief Complaint  Patient presents with  . Suicidal    HPI Penny Hale is a 30 y.o. female with a PMHx of ADHD, bipolar 1 disorder, polysubstance abuse, and manic-depressive, who presents to the ED with complaints of suicidal ideations stating that she feels like "she wants to give up". She states that she "needs to get her mind right". She also reports that she recently relapsed on drugs and alcohol, reports that she had "a lot" of alcohol last night, and yesterday used 3 Xanax's and $20-$30 worth of crack. She has not been on her psychiatric medications in approximately 3 months, states that she was previously on Seroquel and Prozac but cannot recall how much she was taking. Denies HI/AVH. She is here voluntarily. +Smoker. Her only other complaint is malodorous urine and increased urinary frequency, states she "thinks she has a kidney infection". She denies fevers, chills, CP, SOB, abd pain, N/V/D/C, hematuria, dysuria, flank pain, vaginal bleeding/discharge, myalgias, arthralgias, numbness, tingling, weakness, or rashes. LMP was last month when she "had a miscarriage", and hasn't had a menses since then.    The history is provided by the patient and medical records. No language interpreter was used.    Past Medical History:  Diagnosis Date  . ADHD (attention deficit hyperactivity disorder)   . Bipolar 1 disorder (HCC)   . Manic-depressive (HCC)   . Polysubstance abuse    cocaine, opiates, benzos, marijuana  . Trichomonal vaginitis     Patient Active Problem List   Diagnosis Date Noted  . Active labor 01/10/2014  . GERD (gastroesophageal reflux disease) 12/20/2013  . Pyelonephritis complicating pregnancy in second trimester 10/03/2013  . Cocaine abuse complicating pregnancy in  second trimester 10/03/2013  . Benzodiazepine abuse 10/03/2013  . Marijuana use 10/03/2013  . Opiate use 10/03/2013  . Trichomoniasis 10/03/2013  . Late prenatal care complicating pregnancy in third trimester 10/03/2013  . Homeless single person 10/03/2013    Past Surgical History:  Procedure Laterality Date  . DILATION AND CURETTAGE OF UTERUS      OB History    Gravida Para Term Preterm AB Living   7 5 5   2 5    SAB TAB Ectopic Multiple Live Births   2       5       Home Medications    Prior to Admission medications   Medication Sig Start Date End Date Taking? Authorizing Provider  acetaminophen (TYLENOL) 500 MG tablet Take 1,000 mg by mouth every 6 (six) hours as needed for moderate pain.   Yes Historical Provider, MD  ibuprofen (ADVIL,MOTRIN) 600 MG tablet Take 1 tablet (600 mg total) by mouth every 6 (six) hours as needed. Patient not taking: Reported on 02/11/2016 10/30/15   Burgess AmorJulie Idol, PA-C  metroNIDAZOLE (FLAGYL) 500 MG tablet Take 1 tablet (500 mg total) by mouth 2 (two) times daily. Patient not taking: Reported on 02/11/2016 06/19/15   Audry Piliyler Mohr, PA-C    Family History Family History  Problem Relation Age of Onset  . COPD Mother     Social History Social History  Substance Use Topics  . Smoking status: Current Every Day Smoker    Packs/day: 1.00    Years: 12.00    Types: Cigarettes  . Smokeless tobacco:  Never Used  . Alcohol use No     Allergies   Review of patient's allergies indicates no known allergies.   Review of Systems Review of Systems  Constitutional: Negative for chills and fever.  Respiratory: Negative for shortness of breath.   Cardiovascular: Negative for chest pain.  Gastrointestinal: Negative for abdominal pain, constipation, diarrhea, nausea and vomiting.  Genitourinary: Positive for frequency. Negative for dysuria, hematuria, vaginal bleeding and vaginal discharge.       +malodorous urine  Musculoskeletal: Negative for arthralgias  and myalgias.  Skin: Negative for color change.  Allergic/Immunologic: Negative for immunocompromised state.  Neurological: Negative for weakness and numbness.  Psychiatric/Behavioral: Positive for suicidal ideas. Negative for confusion and hallucinations.   10 Systems reviewed and are negative for acute change except as noted in the HPI.   Physical Exam Updated Vital Signs BP 128/88 (BP Location: Right Arm)   Pulse 105   Temp 98.3 F (36.8 C) (Oral)   Resp 16   SpO2 96%   Physical Exam  Constitutional: She is oriented to person, place, and time. Vital signs are normal. She appears well-developed and well-nourished.  Non-toxic appearance. No distress.  Afebrile, nontoxic, NAD  HENT:  Head: Normocephalic and atraumatic.  Mouth/Throat: Oropharynx is clear and moist and mucous membranes are normal.  Eyes: Conjunctivae and EOM are normal. Right eye exhibits no discharge. Left eye exhibits no discharge.  Neck: Normal range of motion. Neck supple.  Cardiovascular: Normal rate, regular rhythm, normal heart sounds and intact distal pulses.  Exam reveals no gallop and no friction rub.   No murmur heard. Pulmonary/Chest: Effort normal and breath sounds normal. No respiratory distress. She has no decreased breath sounds. She has no wheezes. She has no rhonchi. She has no rales.  Abdominal: Soft. Normal appearance and bowel sounds are normal. She exhibits no distension. There is no tenderness. There is no rigidity, no rebound, no guarding, no CVA tenderness, no tenderness at McBurney's point and negative Murphy's sign.  Musculoskeletal: Normal range of motion.  Neurological: She is alert and oriented to person, place, and time. She has normal strength. No sensory deficit.  Skin: Skin is warm, dry and intact. No rash noted.  Psychiatric: Her speech is normal. She is not actively hallucinating. She exhibits a depressed mood. She expresses suicidal ideation. She expresses no homicidal ideation. She  expresses no suicidal plans and no homicidal plans.  Depressed affect, endorsing SI without a specific plan. Denies HI/AVH  Nursing note and vitals reviewed.    ED Treatments / Results  Labs (all labs ordered are listed, but only abnormal results are displayed) Labs Reviewed  COMPREHENSIVE METABOLIC PANEL - Abnormal; Notable for the following:       Result Value   Potassium 3.0 (*)    All other components within normal limits  ACETAMINOPHEN LEVEL - Abnormal; Notable for the following:    Acetaminophen (Tylenol), Serum <10 (*)    All other components within normal limits  CBC - Abnormal; Notable for the following:    Hemoglobin 15.2 (*)    All other components within normal limits  URINE RAPID DRUG SCREEN, HOSP PERFORMED - Abnormal; Notable for the following:    Opiates POSITIVE (*)    Cocaine POSITIVE (*)    Benzodiazepines POSITIVE (*)    Tetrahydrocannabinol POSITIVE (*)    All other components within normal limits  URINALYSIS, ROUTINE W REFLEX MICROSCOPIC (NOT AT [facility]) - Abnormal; Notable for the following:    Color, Urine AMBER (*)  APPearance CLOUDY (*)    Specific Gravity, Urine 1.033 (*)    Bilirubin Urine SMALL (*)    All other components within normal limits  I-STAT BETA HCG BLOOD, ED ([facility], [facility], [facility] ONLY) - Abnormal; Notable for the following:    I-stat hCG, quantitative 81.3 (*)    All other components within normal limits  ETHANOL  SALICYLATE LEVEL    EKG  EKG Interpretation None       Radiology No results found.  Procedures Procedures (including critical care time)  Medications Ordered in ED Medications  alum & mag hydroxide-simeth (MAALOX/MYLANTA) 200-200-20 MG/5ML suspension 30 mL (not administered)  ondansetron (ZOFRAN) tablet 4 mg (not administered)  nicotine (NICODERM CQ - dosed in mg/24 hours) patch 21 mg (not administered)  acetaminophen (TYLENOL) tablet 650 mg (not administered)  penicillin v potassium (VEETID) tablet 500 mg (not  administered)  potassium chloride SA (K-DUR,KLOR-CON) CR tablet 40 mEq (not administered)     Initial Impression / Assessment and Plan / ED Course  I have reviewed the triage vital signs and the nursing notes.  Pertinent labs & imaging results that were available during my care of the patient were reviewed by me and considered in my medical decision making (see chart for details).  Clinical Course    30 y.o. female here voluntarily for SI and substance abuse. States she wants help because she feels like giving up. Also complains of malodorous urine and urinary frequency. Denies HI/AVH. LMP was 1 month ago, states she had a miscarriage at that time, no menses since then. Will get screening labs, U/A, betaHCG, and reassess after that. Will hold off on psych hold orders until we know her pregnancy status. Will reassess shortly   12:25 PM HCG 81.3, could be still lowering from her recent miscarriage, or could be new pregnancy. Will need follow up when she's leaving, discussed finding care at [facility] clinic. U/A neg for UTI. UDS with +opiates, cocaine, benzos, and THC. Tylenol/Salicylate levels WNL, EtOH undetectable. CBC WNL. Pt medically cleared, TTS consulted and psych hold orders placed. Please see their notes for further documentation of care.   Final Clinical Impressions(s) / ED Diagnoses   Final diagnoses:  Suicidal ideation  Hypokalemia  Pregnant state, incidental  Polysubstance abuse    New Prescriptions New Prescriptions   No medications on file     Allen Derry, PA-C 02/11/16 1226    Shaune Pollack, MD 02/12/16 1116

## 2016-02-11 NOTE — BH Assessment (Signed)
[facility] Assessment Progress Note   Case was staffed with Lord DNP who recommended an inpatient admission as appropriate bed placement is investigated.     

## 2016-02-11 NOTE — Progress Notes (Signed)
02/11/16 1355:  LRT met with patient.  Patient was newly admitted.  LRT introduced self to patient and asked if she was interested in any recreational activities.  Pt stated no.  LRT informed pt that she was available if she changed her mind.  Victorino Sparrow, LRT/CTRS

## 2016-02-11 NOTE — ED Notes (Signed)
Pt oriented to room and unit.  Pt is very tearful with minimal eye contact.  Patient contracts for safety.  15 minute checks and video monitoring in place.

## 2016-02-11 NOTE — ED Notes (Signed)
TTS at bedside. 

## 2016-02-11 NOTE — Progress Notes (Signed)
Left pt with a list of uninsured medical providers in [place] in locker # [phone]

## 2016-02-12 ENCOUNTER — Encounter (HOSPITAL_COMMUNITY): Payer: Self-pay | Admitting: *Deleted

## 2016-02-12 DIAGNOSIS — F19929 Other psychoactive substance use, unspecified with intoxication, unspecified: Secondary | ICD-10-CM

## 2016-02-12 DIAGNOSIS — F1994 Other psychoactive substance use, unspecified with psychoactive substance-induced mood disorder: Secondary | ICD-10-CM | POA: Clinically undetermined

## 2016-02-12 DIAGNOSIS — F314 Bipolar disorder, current episode depressed, severe, without psychotic features: Secondary | ICD-10-CM

## 2016-02-12 DIAGNOSIS — F3289 Other specified depressive episodes: Secondary | ICD-10-CM

## 2016-02-12 DIAGNOSIS — F1410 Cocaine abuse, uncomplicated: Secondary | ICD-10-CM | POA: Diagnosis present

## 2016-02-12 DIAGNOSIS — F1120 Opioid dependence, uncomplicated: Secondary | ICD-10-CM | POA: Diagnosis present

## 2016-02-12 LAB — BASIC METABOLIC PANEL
Anion gap: 7 (ref 5–15)
BUN: 12 mg/dL (ref 6–20)
CO2: 24 mmol/L (ref 22–32)
Calcium: 8.8 mg/dL — ABNORMAL LOW (ref 8.9–10.3)
Chloride: 106 mmol/L (ref 101–111)
Creatinine, Ser: 0.7 mg/dL (ref 0.44–1.00)
GFR calc Af Amer: >60 mL/min (ref >60)
GFR calc non Af Amer: >60 mL/min (ref >60)
Glucose, Bld: 101 mg/dL — ABNORMAL HIGH (ref 65–99)
Potassium: 3.9 mmol/L (ref 3.5–5.1)
Sodium: 137 mmol/L (ref 135–145)

## 2016-02-12 LAB — HCG, QUANTITATIVE, PREGNANCY: hCG, Beta Chain, Quant, S: 103 m[IU]/mL — ABNORMAL HIGH (ref <5)

## 2016-02-12 MED ORDER — DIPHENHYDRAMINE HCL 50 MG PO CAPS
50.0000 mg | ORAL_CAPSULE | Freq: Every evening | ORAL | Status: DC | PRN
  Administered 2016-02-12 – 2016-02-13 (×3): 50 mg via ORAL
  Filled 2016-02-12 (×2): qty 1
  Filled 2016-02-12: qty 2
  Filled 2016-02-12 (×6): qty 1

## 2016-02-12 MED ORDER — NICOTINE POLACRILEX 2 MG MT GUM
2.0000 mg | CHEWING_GUM | OROMUCOSAL | Status: DC | PRN
Start: 2016-02-12 — End: 2016-02-14
  Administered 2016-02-12 – 2016-02-13 (×3): 2 mg via ORAL
  Filled 2016-02-12 (×3): qty 1

## 2016-02-12 MED ORDER — BUSPIRONE HCL 5 MG PO TABS
5.0000 mg | ORAL_TABLET | Freq: Three times a day (TID) | ORAL | Status: DC
  Administered 2016-02-12 – 2016-02-14 (×6): 5 mg via ORAL
  Filled 2016-02-12 (×4): qty 1
  Filled 2016-02-12: qty 21
  Filled 2016-02-12 (×3): qty 1
  Filled 2016-02-12 (×2): qty 21
  Filled 2016-02-12 (×3): qty 1

## 2016-02-12 NOTE — Plan of Care (Signed)
Problem: Education: Goal: Ability to make informed decisions regarding treatment will improve Outcome: Progressing Nurse discussed depression/anxiety/coping skills with patient.    
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
## 2016-02-12 NOTE — [facility] Group Notes (Signed)
[facility] LCSW Group Therapy  02/12/2016 1:24 PM  Type of Therapy:  Group Therapy  Participation Level:  Active  Participation Quality:  Attentive  Affect:  Appropriate  Cognitive:  Alert and Oriented  Insight:  Improving  Engagement in Therapy:  Improving  Modes of Intervention:  Discussion, Education, Exploration, Problem-solving, Rapport Building, Socialization and Support  Summary of Progress/Problems: [facility] Speaker came to talk about his personal journey with substance abuse and addiction. The pt processed ways by which to relate to the speaker. [facility] speaker provided handouts and educational information pertaining to groups and services offered by the [facility].   Smart, Reginae LCSW 02/12/2016, 1:24 PM

## 2016-02-12 NOTE — [facility] Group Notes (Signed)
Patient attended AA group meeting.  
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
## 2016-02-12 NOTE — Tx Team (Signed)
Initial Interdisciplinary Treatment Plan   PATIENT STRESSORS: Financial difficulties Loss of family member Marital or family conflict Medication change or noncompliance Substance abuse   PATIENT STRENGTHS: Ability for insight Capable of independent living Communication skills Motivation for treatment/growth Physical Health Supportive family/friends   PROBLEM LIST: Problem List/Patient Goals Date to be addressed Date deferred Reason deferred Estimated date of resolution  At risk for suicide 02/12/2016  02/12/2016   D/C  Substance Abuse 02/12/2016  02/12/2016   D/C  "Coping skills" 02/12/2016  02/12/2016   D/C  "Addiction" 02/12/2016  02/12/2016   D/C                                 DISCHARGE CRITERIA:  Ability to meet basic life and health needs Adequate post-discharge living arrangements Improved stabilization in mood, thinking, and/or behavior Motivation to continue treatment in a less acute level of care Need for constant or close observation no longer present Verbal commitment to aftercare and medication compliance Withdrawal symptoms are absent or subacute and managed without 24-hour nursing intervention  PRELIMINARY DISCHARGE PLAN: Attend 12-step recovery group Outpatient therapy Placement in alternative living arrangements  PATIENT/FAMIILY INVOLVEMENT: This treatment plan has been presented to and reviewed with the patient, Leroy KennedyHeather C Gunnells.  The patient and family have been given the opportunity to ask questions and make suggestions.  Larry SierrasMiddleton, Nasteho Glantz P 02/12/2016, 12:13 AM
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
## 2016-02-12 NOTE — BHH Group Notes (Signed)
The focus of this group is to educate the patient on the purpose and policies of crisis stabilization and provide a format to answer questions about their admission.  The group details unit policies and expectations of patients while admitted.  Patient did not attend 0900 nurse education orientation this morning.  Patient stayed in bed.  

## 2016-02-12 NOTE — Progress Notes (Signed)
Recreation Therapy Notes   Animal-Assisted Activity (AAA) Program Checklist/Progress Notes Patient Eligibility Criteria Checklist & Daily Group note for Rec TxIntervention  Date: 08.08.2017 Time: 2:45pm Location: 400 Hall Dayroom    AAA/T Program Assumption of Risk Form signed by Patient/ or Parent Legal Guardian Yes  Patient is free of allergies or sever asthma Yes  Patient reports no fear of animals Yes  Patient reports no history of cruelty to animals Yes  Patient understands his/her participation is voluntary Yes  Patient washes hands before animal contact Yes  Patient washes hands after animal contact Yes  Behavioral Response: Engaged, Attentive  Education:Hand Washing, Appropriate Animal Interaction   Education Outcome: Acknowledges education.   Clinical Observations/Feedback: Patient attended session and interacted appropriately with therapy dog and peers. .    Tiffanyann Deroo L Leoma Folds, LRT/CTRS  Penny Hale L 02/12/2016 3:04 PM 
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
## 2016-02-12 NOTE — Progress Notes (Signed)
D:  Patient's self inventory sheet, patient has fair sleep, no sleep medication given.  Good appetite, normal energy level, poor concentration.  Rated depression 5, hopeless and anxiety 6.  Denied withdrawals.  Denied SI.  Physical problems, burning sensation while urinating.  Painful tooth, back ache,  Pain medication not helpful.  Goal is to not cry. A:  Medications administered per MD orders.  Emotional support and encouragement given patient. R:  Denied SI and HI, contracts for safety.  Denied A/V hallucinations.  Safety maintained with 15 minute checks.

## 2016-02-12 NOTE — Progress Notes (Signed)
Nurse informed MD that [facility] and [facility] gave this message:  That HCG should be performed in 2 days, Thursday 02/14/2016.  If number is rising, patient may be pregnant.  If number is lower than 103 then patient is probably not pregnant.  Patient stated she does not have pain across her abdomen and is not bleeding.  Nurse was informed that patient should go to outpatient [facility].

## 2016-02-13 DIAGNOSIS — F1994 Other psychoactive substance use, unspecified with psychoactive substance-induced mood disorder: Secondary | ICD-10-CM

## 2016-02-13 LAB — GC/CHLAMYDIA PROBE AMP (~~LOC~~) NOT AT ARMC
Chlamydia: NEGATIVE
NEISSERIA GONORRHEA: NEGATIVE

## 2016-02-13 MED ORDER — OLANZAPINE 5 MG PO TABS
5.0000 mg | ORAL_TABLET | Freq: Every day | ORAL | Status: DC
  Administered 2016-02-13: 5 mg via ORAL
  Filled 2016-02-13 (×2): qty 1
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
## 2016-02-13 NOTE — Progress Notes (Signed)
Recreation Therapy Notes  Date: 02/13/16  Time: 0930 Location: 300 Hall Group Room  Group Topic: Stress Management  Goal Area(s) Addresses:  Patient will verbalize importance of using healthy stress management.  Patient will identify positive emotions associated with healthy stress management.   Intervention: Stress Management  Activity :  Deep Breathing.  LRT introduced patients to the stress management technique of deep breathing.  Pt were to follow along as LRT demonstrated and read a script to allow patients to participate in the activity.  Education:  Stress Management, Discharge Planning.   Education Outcome: Acknowledges edcuation/In group clarification offered/Needs additional education  Clinical Observations/Feedback: Pt did not attend group.    Shannel Zahm, LRT/CTRS  

## 2016-02-13 NOTE — Progress Notes (Signed)
Patient has been irritable, tearful and demanding to be released.  Patient stated that nothing here is working for her and she needs to be let out of here because "they took all of her medications away".  Patient has been anxious, but denies SI, HI and AVH.    Assess patient for safety, offer medications as prescribed, engage patient in 1:1 staff talks.   Continue to monitor, offer medications as prescribed.

## 2016-02-13 NOTE — BHH Group Notes (Signed)
[facility] Note   02/13/2016 9:31 AM  Participation Quality:  Invited. DID NOT ATTEND. Pt chose to remain in bed.   Smart, Zavannah LCSW

## 2016-02-13 NOTE — Progress Notes (Signed)
Pt has been visible in the dayroom most of the evening.  She did attend evening AA group.  She denies SI/HI/AVH.  She denies any withdrawal symptoms at this time, but has c/o back pain this evening for which she received Tylenol.  Pt is worried about being pregnant again and says she does not need that in her life right now.  Pt has been appropriate this evening.  Pt makes her needs known to staff.  Support and encouragement offered.  Discharge plans are in process.  Safety maintained with q15 minute checks.
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
## 2016-02-13 NOTE — Progress Notes (Signed)
[facility] MD Progress Note  02/13/2016 12:58 PM TRENITY PHA  MRN:  [id number]  Subjective: Radley reports, "I'm not doing well. I tossed & turned all night long. I could not sleep. I felt better yesterday than today. I could not go to group milieu because the female patients keep commenting on my butt. There a lot of men in the group milieu, I don't want them to touch".  Objective: Nickol is seen, chart reviewed. She is sitting up on her bed during this assessment. She states that she is not feeling well today. She states that she did not sleep at all last night as she tossed & turned all night long. She asked as to when she will start her medications for her mood disorder. She complained that there are a lot of female patients in the group milieu. That when she went to the group session yesterday, that the female patients were talking about her butt. Annita is instructed & explained that this is a zero tolerance facility for any form of inappropriate behavior or comments towards any patient or staff. She is encouraged to start attending group sessions again & to report any form of inappropriate comments from any patient towards her to the staff immediately. However, when this complaint was brought to the attention of the staff, staff reports indicated that the reverse is the case because Brae was reported to be flirtatious during the milieu yesterday & was instructed by staff that such behavior is not tolerated & or condoned in this hospital. She is started on Zyprexa 5 mg Q hs for mood control.  Principal Problem: Substance or medication-induced depressive disorder with onset during intoxication [facility])  Diagnosis:   Patient Active Problem List   Diagnosis Date Noted  . Polysubstance dependence including opioid type drug without complication, continuous use ([place]) [F11.220] 02/11/2016    Priority: Medium  . Substance or medication-induced depressive disorder with onset during intoxication ([place]) [F19.94]  02/12/2016  . Cocaine use disorder, mild, abuse [F14.10] 02/12/2016  . Opioid use disorder, moderate, dependence ([place]) [F11.20] 02/12/2016  . Active labor [O80, Z37.9] 01/10/2014  . GERD (gastroesophageal reflux disease) [K21.9] 12/20/2013  . Pyelonephritis complicating pregnancy in second trimester [O23.02, N12] 10/03/2013  . Cocaine abuse complicating pregnancy in second trimester [O99.322, F14.10] 10/03/2013  . Benzodiazepine abuse [F13.10] 10/03/2013  . Marijuana use [F12.10] 10/03/2013  . Opiate use [F11.90] 10/03/2013  . Trichomoniasis [A59.9] 10/03/2013  . Late prenatal care complicating pregnancy in third trimester [O09.33] 10/03/2013  . Homeless single person [Z59.0] 10/03/2013   Total Time spent with patient: 25 minuteas  Past Psychiatric History: Bipolar affective disorder  Past Medical History:  Past Medical History:  Diagnosis Date  . ADHD (attention deficit hyperactivity disorder)   . Bipolar 1 disorder ([place])   . Manic-depressive ([place])   . Polysubstance abuse    cocaine, opiates, benzos, marijuana  . Trichomonal vaginitis     Past Surgical History:  Procedure Laterality Date  . DILATION AND CURETTAGE OF UTERUS     Family History:  Family History  Problem Relation Age of Onset  . COPD Mother    Family Psychiatric  History: See H&P  Social History:  History  Alcohol Use No     History  Drug Use  . Types: Cocaine, Marijuana    Social History   Social History  . Marital status: Single    Spouse name: N/A  . Number of children: N/A  . Years of education: N/A   Social History  Main Topics  . Smoking status: Current Every Day Smoker    Packs/day: 1.00    Years: 12.00    Types: Cigarettes  . Smokeless tobacco: Never Used  . Alcohol use No  . Drug use:     Types: Cocaine, Marijuana  . Sexual activity: Yes    Birth control/ protection: Condom   Other Topics Concern  . None   Social History Narrative  . None   Additional Social History:    Sleep: "Poor, I tossed & turned all night long"  Appetite:  Fair  Current Medications: Current Facility-Administered Medications  Medication Dose Route Frequency Provider Last Rate Last Dose  . acetaminophen (TYLENOL) tablet 650 mg  650 mg Oral Q6H PRN Patrecia Pour, NP   650 mg at 02/12/16 2105  . alum & mag hydroxide-simeth (MAALOX/MYLANTA) 200-200-20 MG/5ML suspension 30 mL  30 mL Oral Q4H PRN Patrecia Pour, NP      . busPIRone (BUSPAR) tablet 5 mg  5 mg Oral TID Ursula Alert, MD   5 mg at 02/13/16 1215  . diphenhydrAMINE (BENADRYL) capsule 50 mg  50 mg Oral QHS,MR X 1 Saramma Eappen, MD   50 mg at 02/12/16 2151  . magnesium hydroxide (MILK OF MAGNESIA) suspension 30 mL  30 mL Oral Daily PRN Patrecia Pour, NP      . nicotine polacrilex (NICORETTE) gum 2 mg  2 mg Oral PRN Ursula Alert, MD   2 mg at 02/12/16 1845   Lab Results:  Results for orders placed or performed during the hospital encounter of 02/11/16 (from the past 48 hour(s))  Basic metabolic panel     Status: Abnormal   Collection Time: 02/12/16  6:27 AM  Result Value Ref Range   Sodium 137 135 - 145 mmol/L   Potassium 3.9 3.5 - 5.1 mmol/L   Chloride 106 101 - 111 mmol/L   CO2 24 22 - 32 mmol/L   Glucose, Bld 101 (H) 65 - 99 mg/dL   BUN 12 6 - 20 mg/dL   Creatinine, Ser 0.70 0.44 - 1.00 mg/dL   Calcium 8.8 (L) 8.9 - 10.3 mg/dL   GFR calc non Af Amer >60 >60 mL/min   GFR calc Af Amer >60 >60 mL/min    Comment: (NOTE) The eGFR has been calculated using the CKD EPI equation. This calculation has not been validated in all clinical situations. eGFR's persistently <60 mL/min signify possible Chronic Kidney Disease.    Anion gap 7 5 - 15    Comment: Performed at [facility]  hCG, quantitative, pregnancy     Status: Abnormal   Collection Time: 02/12/16  6:27 AM  Result Value Ref Range   hCG, Beta Chain, Quant, S 103 (H) <5 mIU/mL    Comment:          GEST. AGE      CONC.  (mIU/mL)   <=1 WEEK         5 - 50     2 WEEKS       50 - 500     3 WEEKS       100 - 10,000     4 WEEKS     1,000 - 30,000     5 WEEKS     3,500 - 115,000   6-8 WEEKS     12,000 - 270,000    12 WEEKS     15,000 - 220,000        FEMALE AND  NON-PREGNANT FEMALE:     LESS THAN 5 mIU/mL Performed at [facility]    Blood Alcohol level:  Lab Results  Component Value Date   [facility] <5 02/11/2016   ETH <11 91/50/5697   Metabolic Disorder Labs: No results found for: HGBA1C, MPG No results found for: PROLACTIN No results found for: CHOL, TRIG, HDL, CHOLHDL, VLDL, LDLCALC  Physical Findings: AIMS: Facial and Oral Movements Muscles of Facial Expression: None, normal Lips and Perioral Area: None, normal Jaw: None, normal Tongue: None, normal,Extremity Movements Upper (arms, wrists, hands, fingers): None, normal Lower (legs, knees, ankles, toes): None, normal, Trunk Movements Neck, shoulders, hips: None, normal, Overall Severity Severity of abnormal movements (highest score from questions above): None, normal Incapacitation due to abnormal movements: None, normal Patient's awareness of abnormal movements (rate only patient's report): No Awareness, Dental Status Current problems with teeth and/or dentures?: No Does patient usually wear dentures?: No  CIWA:  CIWA-Ar Total: 1 COWS:  COWS Total Score: 2  Musculoskeletal: Strength & Muscle Tone: within normal limits Gait & Station: normal Patient leans: N/A  Psychiatric Specialty Exam: Physical Exam  ROS  Blood pressure 122/71, pulse 82, temperature 98.9 F (37.2 C), temperature source Oral, resp. rate 16, height _0  (1.6 m), weight 74.8 kg (165 lb), unknown if currently breastfeeding.Body mass index is 29.23 kg/m.  General Appearance: Casual  Eye Contact:  Good  Speech:  Clear and Coherent  Volume:  Normal  Mood:  Depressed, anxious, rates depression #7, anxiety #6  Affect:  Restricted  Thought Process:  Coherent  Orientation:   Full (Time, Place, and Person)  Thought Content:  Denies any hallucinations, delusions or paranoia.  Suicidal Thoughts:  Denies any thoughts, plans or intent.  Homicidal Thoughts:  Denies any thoughts, plans or intent.  Memory:  Immediate;   Good Recent;   Good Remote;   Good  Judgement:  Fair  Insight:  Fair  Psychomotor Activity:  Normal  Concentration:  Concentration: Good and Attention Span: Good  Recall:  Good  Fund of Knowledge:  Good  Language:  Good  Akathisia:  Negative  Handed:  Right  AIMS (if indicated):     Assets:  Communication Skills Desire for Improvement Physical Health  ADL's:  Intact  Cognition:  WNL  Sleep:  Number of Hours: 6     Treatment Plan Summary: Daily contact with patient to assess and evaluate symptoms and progress in treatment and Medication management: Mood instability: Initiate Zyprexa 5 mg Q hs for mood control. Anxiety: Continue Buspar 5 mg tid. Insomnia: Continue the Benadryl 50 mg Q hs. Coping skills: Encourage group session participation. - Continue 15 minutes observation for safety concerns - Encouraged to participate in milieu therapy and group therapy counseling sessions and also work with coping skills -  Develop treatment plan to decrease risk of relapse upon discharge and to reduce the need for readmission. -  Psycho-social education regarding relapse prevention and self care. - Health care follow up as needed for medical problems. - Restart home medications where appropriate.  Encarnacion Slates, NP, PMHNP, FNP-BC 02/13/2016, 12:58 PM

## 2016-02-14 LAB — RPR: RPR: NONREACTIVE

## 2016-02-14 MED ORDER — NICOTINE POLACRILEX 2 MG MT GUM: 2.0000 mg | CHEWING_GUM | OROMUCOSAL | 0 refills | Status: DC | PRN

## 2016-02-14 MED ORDER — OLANZAPINE 2.5 MG PO TABS: 2.5000 mg | ORAL_TABLET | Freq: Every day | ORAL | 0 refills | Status: DC

## 2016-02-14 MED ORDER — BUSPIRONE HCL 5 MG PO TABS: 5.0000 mg | ORAL_TABLET | Freq: Three times a day (TID) | ORAL | 0 refills | Status: DC

## 2016-02-14 MED ORDER — ACETAMINOPHEN 500 MG PO TABS: 1000.0000 mg | ORAL_TABLET | Freq: Four times a day (QID) | ORAL | 0 refills | Status: DC | PRN

## 2016-02-14 MED ORDER — OLANZAPINE 5 MG PO TABS: 5.0000 mg | ORAL_TABLET | Freq: Every day | ORAL | 0 refills | Status: DC

## 2016-02-14 MED ORDER — OLANZAPINE 2.5 MG PO TABS
2.5000 mg | ORAL_TABLET | Freq: Every day | ORAL | Status: DC
  Filled 2016-02-14: qty 1
  Filled 2016-02-14: qty 7
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
## 2016-02-14 NOTE — Progress Notes (Addendum)
Nursing Note 02/14/2016 0700-discharge  Data Asleep during AM med pass but did come up for meds with prompting, then returned to bed afterwards.  Did not attend AM group.Reports sleeping fair without PRN sleep med.  Rates depression 6/10, hopelessness 6/10, and anxiety 6/10. Denies physical complaints.  Affect blunted but appropriate  mood "anxious to get out of here."  Denied HI, SI, AVH.    Action Received discharge order this AM.  Spoke with patient 1:1, nurse offered support to patient throughout shift.  Reviewed discharge paperwork, scripts, follow up appointments with patient.  Given copy of discharge paperwork, scripts, and belongings (per belongings record).  Denied SI/HI at discharge. Discharged to lobby at 1119, stated she had a ride.  Response Patient verbalized understanding of discharge instructions.  To follow up per AVS.

## 2016-02-14 NOTE — [facility] Suicide Risk Assessment (Signed)
[facility] INPATIENT:  Family/Significant Other Suicide Prevention Education  Suicide Prevention Education:  Contact Attempts: Penny Hale (pt's friend) [phone] has been identified by the patient as the family member/significant other with whom the patient will be residing, and identified as the person(s) who will aid the patient in the event of a mental health crisis.  With written consent from the patient, two attempts were made to provide suicide prevention education, prior to and/or following the patient's discharge.  We were unsuccessful in providing suicide prevention education.  A suicide education pamphlet was given to the patient to share with family/significant other.  Date and time of first attempt: 02/13/16 at 4:31PM  Date and time of second attempt: 02/14/16 at 8:45AM   Pulte HomesHeather N Smart LCSW 02/14/2016, 8:55 AM

## 2016-02-14 NOTE — Progress Notes (Signed)
Pt has been visible in the milieu and spent most of the evening in the dayroom.  When writer spoke to pt, she is anticipating being discharged on Thursday.  She denies SI/HI/AVH.  She understands that [facility].  She made no mention of the situation in the dayroom from the previous day.  Pt makes her needs know to staff.  She was educated on the zyprexa she started tonight.  Support and encouragement offered.  Discharge plans are in process.  Safety maintained with q15 minute checks.
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
## 2016-07-07 NOTE — L&D Delivery Note (Signed)
Delivery Note At 3:56 PM a healthy female was delivered via  (Presentation: LOA ).  APGAR:8 ,9 ; weight pending.   Placenta status: intact , .  Cord: 3 vessel cord with the following complications: none.    Anesthesia:  epidural Episiotomy: None Lacerations: None Suture Repair: N/A Est. Blood Loss (mL): 150  Mom to postpartum.  Baby to Couplet care / Skin to Skin.  Penny Hale 02/02/2017, 4:15 PM  Patient is 31 y.o. [id number] admitted for SOL, hx of polysubstance use  Upon arrival patient was complete. She pushed with good maternal effort to deliver a viable infant.  Baby delivered without difficulty, was noted to have good tone and place on maternal abdomen for oral suctioning, drying and stimulation. Delayed cord clamping performed.    Vaginal canal and perineum was inspected and hemostatic. Pitocin was started and uterus massaged until bleeding slowed.  Fundus firm on exam with massage.  Counts of sharps, instruments, and lap pads were all correct.  Penny AdaJazma Mariaceleste Herrera, DO OB Fellow Faculty Practice, [facility] 02/02/2017, 4:36 PM

## 2016-08-25 ENCOUNTER — Encounter: Payer: Self-pay | Admitting: Adult Health

## 2016-09-08 ENCOUNTER — Encounter: Payer: Self-pay | Admitting: Adult Health

## 2016-09-29 ENCOUNTER — Other Ambulatory Visit: Payer: Self-pay | Admitting: Obstetrics and Gynecology

## 2016-09-29 DIAGNOSIS — Z363 Encounter for antenatal screening for malformations: Secondary | ICD-10-CM

## 2016-09-30 ENCOUNTER — Ambulatory Visit (INDEPENDENT_AMBULATORY_CARE_PROVIDER_SITE_OTHER): Payer: Medicaid Other

## 2016-09-30 ENCOUNTER — Encounter (INDEPENDENT_AMBULATORY_CARE_PROVIDER_SITE_OTHER): Payer: Self-pay

## 2016-09-30 DIAGNOSIS — Z363 Encounter for antenatal screening for malformations: Secondary | ICD-10-CM

## 2016-09-30 NOTE — Progress Notes (Addendum)
US 23 wks,cephalic,ant pl gr 0,normal ovaries bilat,fhr 155 bpm,cx 3.2 cm,svp of fluid 4.4 cm,anatomy complete,no obvious abnormalities seen,EDD 01/27/2017 by ultrasound

## 2016-10-16 ENCOUNTER — Ambulatory Visit (INDEPENDENT_AMBULATORY_CARE_PROVIDER_SITE_OTHER): Payer: Medicaid Other | Admitting: Women's Health

## 2016-10-16 ENCOUNTER — Encounter: Payer: Self-pay | Admitting: Women's Health

## 2016-10-16 ENCOUNTER — Other Ambulatory Visit (HOSPITAL_COMMUNITY)
Admission: RE | Admit: 2016-10-16 | Discharge: 2016-10-16 | Disposition: A | Discharge status: Home / Self Care | Payer: Medicaid Other | Source: Ambulatory Visit | Attending: Obstetrics & Gynecology | Admitting: Obstetrics & Gynecology

## 2016-10-16 ENCOUNTER — Ambulatory Visit: Payer: Medicaid Other | Admitting: *Deleted

## 2016-10-16 VITALS — BP 124/68 | HR 98 | Wt 171.0 lb

## 2016-10-16 DIAGNOSIS — O99332 Smoking (tobacco) complicating pregnancy, second trimester: Secondary | ICD-10-CM | POA: Diagnosis not present

## 2016-10-16 DIAGNOSIS — Z8279 Family history of other congenital malformations, deformations and chromosomal abnormalities: Secondary | ICD-10-CM

## 2016-10-16 DIAGNOSIS — O0992 Supervision of high risk pregnancy, unspecified, second trimester: Secondary | ICD-10-CM

## 2016-10-16 DIAGNOSIS — Z8759 Personal history of other complications of pregnancy, childbirth and the puerperium: Secondary | ICD-10-CM

## 2016-10-16 DIAGNOSIS — Z3482 Encounter for supervision of other normal pregnancy, second trimester: Secondary | ICD-10-CM | POA: Insufficient documentation

## 2016-10-16 DIAGNOSIS — Z331 Pregnant state, incidental: Secondary | ICD-10-CM

## 2016-10-16 DIAGNOSIS — F17210 Nicotine dependence, cigarettes, uncomplicated: Secondary | ICD-10-CM

## 2016-10-16 DIAGNOSIS — O99322 Drug use complicating pregnancy, second trimester: Secondary | ICD-10-CM

## 2016-10-16 DIAGNOSIS — F1410 Cocaine abuse, uncomplicated: Secondary | ICD-10-CM

## 2016-10-16 DIAGNOSIS — Z1389 Encounter for screening for other disorder: Secondary | ICD-10-CM

## 2016-10-16 DIAGNOSIS — Z87440 Personal history of urinary (tract) infections: Secondary | ICD-10-CM

## 2016-10-16 DIAGNOSIS — Z3A25 25 weeks gestation of pregnancy: Secondary | ICD-10-CM

## 2016-10-16 DIAGNOSIS — F418 Other specified anxiety disorders: Secondary | ICD-10-CM

## 2016-10-16 DIAGNOSIS — O0990 Supervision of high risk pregnancy, unspecified, unspecified trimester: Secondary | ICD-10-CM

## 2016-10-16 DIAGNOSIS — F17200 Nicotine dependence, unspecified, uncomplicated: Secondary | ICD-10-CM | POA: Insufficient documentation

## 2016-10-16 LAB — POCT URINALYSIS DIPSTICK
Glucose, UA: NEGATIVE
Ketones, UA: NEGATIVE
Leukocytes, UA: NEGATIVE
NITRITE UA: NEGATIVE
Protein, UA: NEGATIVE
RBC UA: NEGATIVE

## 2016-10-16 NOTE — Patient Instructions (Addendum)
You will have your sugar test next visit.  Please do not eat or drink anything after midnight the night before you come, not even water.  You will be here for at least two hours.     24oz Sprite (78g carbs), 28 Brach's jelly beans, or 15 Strawberry Twizzlers  Call the office [phone]) or go to [facility] if:  You begin to have strong, frequent contractions  Your water breaks.  Sometimes it is a big gush of fluid, sometimes it is just a trickle that keeps getting your panties wet or running down your legs  You have vaginal bleeding.  It is normal to have a small amount of spotting if your cervix was checked.   You don't feel your baby moving like normal.  If you don't, get you something to eat and drink and lay down and focus on feeling your baby move.   If your baby is still not moving like normal, you should call the office or go to [facility].  Second Trimester of Pregnancy The second trimester is from week 13 through week 28, months 4 through 6. The second trimester is often a time when you feel your best. Your body has also adjusted to being pregnant, and you begin to feel better physically. Usually, morning sickness has lessened or quit completely, you may have more energy, and you may have an increase in appetite. The second trimester is also a time when the fetus is growing rapidly. At the end of the sixth month, the fetus is about 9 inches long and weighs about 1 pounds. You will likely begin to feel the baby move (quickening) between 18 and 20 weeks of the pregnancy. BODY CHANGES Your body goes through many changes during pregnancy. The changes vary from woman to woman.   Your weight will continue to increase. You will notice your lower abdomen bulging out.  You may begin to get stretch marks on your hips, abdomen, and breasts.  You may develop headaches that can be relieved by medicines approved by your health care provider.  You may urinate more often because the  fetus is pressing on your bladder.  You may develop or continue to have heartburn as a result of your pregnancy.  You may develop constipation because certain hormones are causing the muscles that push waste through your intestines to slow down.  You may develop hemorrhoids or swollen, bulging veins (varicose veins).  You may have back pain because of the weight gain and pregnancy hormones relaxing your joints between the bones in your pelvis and as a result of a shift in weight and the muscles that support your balance.  Your breasts will continue to grow and be tender.  Your gums may bleed and may be sensitive to brushing and flossing.  Dark spots or blotches (chloasma, mask of pregnancy) may develop on your face. This will likely fade after the baby is born.  A dark line from your belly button to the pubic area (linea nigra) may appear. This will likely fade after the baby is born.  You may have changes in your hair. These can include thickening of your hair, rapid growth, and changes in texture. Some women also have hair loss during or after pregnancy, or hair that feels dry or thin. Your hair will most likely return to normal after your baby is born. WHAT TO EXPECT AT YOUR PRENATAL VISITS During a routine prenatal visit:  You will be weighed to make sure you and  the fetus are growing normally.  Your blood pressure will be taken.  Your abdomen will be measured to track your baby's growth.  The fetal heartbeat will be listened to.  Any test results from the previous visit will be discussed. Your health care provider may ask you:  How you are feeling.  If you are feeling the baby move.  If you have had any abnormal symptoms, such as leaking fluid, bleeding, severe headaches, or abdominal cramping.  If you have any questions. Other tests that may be performed during your second trimester include:  Blood tests that check for:  Low iron levels (anemia).  Gestational  diabetes (between 24 and 28 weeks).  Rh antibodies.  Urine tests to check for infections, diabetes, or protein in the urine.  An ultrasound to confirm the proper growth and development of the baby.  An amniocentesis to check for possible genetic problems.  Fetal screens for spina bifida and Down syndrome. HOME CARE INSTRUCTIONS   Avoid all smoking, herbs, alcohol, and unprescribed drugs. These chemicals affect the formation and growth of the baby.  Follow your health care provider's instructions regarding medicine use. There are medicines that are either safe or unsafe to take during pregnancy.  Exercise only as directed by your health care provider. Experiencing uterine cramps is a good sign to stop exercising.  Continue to eat regular, healthy meals.  Wear a good support bra for breast tenderness.  Do not use hot tubs, steam rooms, or saunas.  Wear your seat belt at all times when driving.  Avoid raw meat, uncooked cheese, cat litter boxes, and soil used by cats. These carry germs that can cause birth defects in the baby.  Take your prenatal vitamins.  Try taking a stool softener (if your health care provider approves) if you develop constipation. Eat more high-fiber foods, such as fresh vegetables or fruit and whole grains. Drink plenty of fluids to keep your urine clear or pale yellow.  Take warm sitz baths to soothe any pain or discomfort caused by hemorrhoids. Use hemorrhoid cream if your health care provider approves.  If you develop varicose veins, wear support hose. Elevate your feet for 15 minutes, 3-4 times a day. Limit salt in your diet.  Avoid heavy lifting, wear low heel shoes, and practice good posture.  Rest with your legs elevated if you have leg cramps or low back pain.  Visit your dentist if you have not gone yet during your pregnancy. Use a soft toothbrush to brush your teeth and be gentle when you floss.  A sexual relationship may be continued unless  your health care provider directs you otherwise.  Continue to go to all your prenatal visits as directed by your health care provider. SEEK MEDICAL CARE IF:   You have dizziness.  You have mild pelvic cramps, pelvic pressure, or nagging pain in the abdominal area.  You have persistent nausea, vomiting, or diarrhea.  You have a bad smelling vaginal discharge.  You have pain with urination. SEEK IMMEDIATE MEDICAL CARE IF:   You have a fever.  You are leaking fluid from your vagina.  You have spotting or bleeding from your vagina.  You have severe abdominal cramping or pain.  You have rapid weight gain or loss.  You have shortness of breath with chest pain.  You notice sudden or extreme swelling of your face, hands, ankles, feet, or legs.  You have not felt your baby move in over an hour.  You have severe  headaches that do not go away with medicine.  You have vision changes. Document Released: 06/17/2001 Document Revised: 06/28/2013 Document Reviewed: 08/24/2012 [facility] Patient Information 2015 [place], [place]. This information is not intended to replace advice given to you by your health care provider. Make sure you discuss any questions you have with your health care provider.

## 2016-10-16 NOTE — Progress Notes (Signed)
Subjective:  Penny Hale is a 31 y.o. [id number] Caucasian female at [redacted]w[redacted]d by 23wk u/s, being seen today for her first obstetrical visit.  Her obstetrical history is significant for (1) late to care, (2) smoker 1ppd, does want to quit, (3) polysubstance abuse- cocaine, percocet , THC- last used on Monday, (4) Subutex therapy  TID- just started 2 weeks ago at [facility] in [place]- gets rx on Mon & Wed, goes to group therapy on Mon, sees MD on Wed, has 1 on 1 counseling prn- has appt today at 52- they are aware of her relapse on Monday (5) term uncomplicated svb x 5, sab x 2 (one @ 16wks born w/o legs/arms), (6) depression/anxiety- no meds currently- does want to get back on meds- has appt today w/ counselor to discuss, denies SI/HI.  Does not have custody of any of her children, is able to see 2 of them that live w/ family members when she wants to. Plans to give this baby up for adoption. Wants in-hospital BTL. Pregnancy history fully reviewed.  Patient reports depression- talking to her counselor today about getting back on meds. Denies vb, cramping, uti s/s, abnormal/malodorous vag d/c, or vulvovaginal itching/irritation.  BP 124/68   Pulse 98   Wt 171 lb (77.6 kg)   LMP  (LMP Unknown)   BMI 30.29 kg/m   HISTORY: OB History  Gravida Para Term Preterm AB Living  SAB TAB Ectopic Multiple Live Births  2       5    # Outcome Date GA Lbr Len/2nd Weight Sex Delivery Anes PTL Lv  8 Current           7 Term 01/10/14 [redacted]w[redacted]d 07:35 / 00:18 5 lb 4 oz (2.381 kg) M Vag-Spont EPI N LIV     Birth Comments: within normal limts  6 SAB 08/04/11 [redacted]w[redacted]d            Birth Comments: no arms and legs  5 Term 03/08/11 [redacted]w[redacted]d  6 lb 6 oz (2.892 kg) M Vag-Spont None N LIV  4 Term 03/17/09 [redacted]w[redacted]d  6 lb 5 oz (2.863 kg) F Vag-Spont EPI N LIV  3 Term 12/11/05 [redacted]w[redacted]d  6 lb 3 oz (2.807 kg) F Vag-Spont EPI N LIV  2 SAB 11/02/03 [redacted]w[redacted]d         1 Term 04/01/01 [redacted]w[redacted]d  8 lb 5 oz (3.771 kg) F  Vag-Spont EPI N LIV     Past Medical History:  Diagnosis Date  . ADHD (attention deficit hyperactivity disorder)   . Bipolar 1 disorder (HCC)   . Manic-depressive (HCC)   . Polysubstance abuse    cocaine, opiates, benzos, marijuana  . Seizures (HCC)    drug related  . Trichomonal vaginitis    Past Surgical History:  Procedure Laterality Date  . DILATION AND CURETTAGE OF UTERUS     Family History  Problem Relation Age of Onset  . COPD Mother   . Emphysema Mother   . Mental illness Sister   . Depression Sister   . Mental illness Brother   . Depression Brother   . ADD / ADHD Son     Exam   System:     General: Well developed & nourished, very fidgety, crying and upset b/c she has 'done so bad'   Skin: Warm & dry, normal coloration and turgor, no rashes   Neurologic: Alert & oriented, normal mood   Cardiovascular: Regular rate &  rhythm   Respiratory: Effort & rate normal, LCTAB, acyanotic   Abdomen: Soft, non tender   Extremities: normal strength, tone   Pelvic Exam:    Perineum: Normal perineum   Vulva: Normal, no lesions   Vagina:  Normal mucosa, normal discharge   Cervix: Normal, bulbous, appears closed   Uterus: Normal size/shape/contour for GA   Thin prep pap smear obtained w/ high risk HPV cotesting FHR: 150 via doppler FH: 26cm   Assessment:   Pregnancy: [id number] Patient Active Problem List   Diagnosis Date Noted  . Encounter for supervision of other normal pregnancy 10/16/2016  . 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 Cocaine use disorder, mild, abuse 02/12/2016  . Opioid use disorder, moderate, dependence (HCC) 02/12/2016  . Polysubstance dependence including opioid type drug without complication, continuous use (HCC) 02/11/2016  . GERD (gastroesophageal reflux disease) 12/20/2013  . Pyelonephritis complicating pregnancy in second trimester 10/03/2013  . Cocaine abuse complicating  pregnancy in second trimester 10/03/2013  . Benzodiazepine abuse 10/03/2013  . Marijuana use 10/03/2013  . Opiate use 10/03/2013  . Trichomoniasis 10/03/2013  . Late prenatal care complicating pregnancy in third trimester 10/03/2013  . Homeless single person 10/03/2013    [redacted]w[redacted]d [id number] New OB visit Late care Polysubstance abuse- cocaine, opioids, thc Subutex therapy  Depression/anxiety- no meds Smoker H/o child w/ birth defect- 16wk SAB/fetus w/o arms/legs Plans BTL  Plan:  Initial labs obtained Continue prenatal vitamins Problem list reviewed and updated Reviewed ptl s/s, fm Reviewed recommended weight gain based on pre-gravid BMI Encouraged well-balanced diet Genetic Screening discussed Quad Screen: too late Cystic fibrosis screening discussed declined Ultrasound discussed; fetal survey: results reviewed Follow up in 2 weeks for visit w/ MD and PN2 CCNC completed-unable to stay and talk w/ PCM today d/t needing to be at appt at [facility] to discuss recent relapse/dep/anxiety- does feel she needs to be back on antidepressants- pt to notify us if this is not initiated today at that appt Advised no further opiates/thc/cocaine/illicit drugs Smokes 1pp/day, advised cessation, discussed risks to fetus while pregnant, to infant pp, and to herself. Offered [facility], accepted, referral sent.    Plans to give baby up for adoption Discussed NAS/NICU tour- does want to do this, even though she is planning adoption Wants in-hospital BTL   Marge Duncans CNM, [facility] 10/16/2016 11:18 AM

## 2016-10-17 LAB — PMP SCREEN PROFILE (10S), URINE
AMPHETAMINE SCRN UR: NEGATIVE ng/mL
BARBITURATE SCRN UR: NEGATIVE ng/mL
Benzodiazepine Screen, Urine: NEGATIVE ng/mL
COCAINE(METAB.) SCREEN, URINE: POSITIVE ng/mL
CREATININE(CRT), U: 171.4 mg/dL (ref 20.0–300.0)
Cannabinoids Ur Ql Scn: POSITIVE ng/mL
METHADONE SCREEN, URINE: NEGATIVE ng/mL
OPIATE SCRN UR: NEGATIVE ng/mL
Oxycodone+Oxymorphone Ur Ql Scn: NEGATIVE ng/mL
PCP Scrn, Ur: NEGATIVE ng/mL
PROPOXYPHENE SCREEN: NEGATIVE ng/mL
Ph of Urine: 6.2 (ref 4.5–8.9)

## 2016-10-17 LAB — MICROSCOPIC EXAMINATION
Bacteria, UA: NONE SEEN
CASTS: NONE SEEN /LPF

## 2016-10-17 LAB — HIV ANTIBODY (ROUTINE TESTING W REFLEX): HIV SCREEN 4TH GENERATION: NONREACTIVE

## 2016-10-17 LAB — CBC
Hematocrit: 38.7 % (ref 34.0–46.6)
Hemoglobin: 12.7 g/dL (ref 11.1–15.9)
MCH: 31.1 pg (ref 26.6–33.0)
MCHC: 32.8 g/dL (ref 31.5–35.7)
MCV: 95 fL (ref 79–97)
PLATELETS: 241 10*3/uL (ref 150–379)
RBC: 4.08 x10E6/uL (ref 3.77–5.28)
RDW: 13.7 % (ref 12.3–15.4)
WBC: 8.4 10*3/uL (ref 3.4–10.8)

## 2016-10-17 LAB — URINALYSIS, ROUTINE W REFLEX MICROSCOPIC
Bilirubin, UA: NEGATIVE
GLUCOSE, UA: NEGATIVE
Ketones, UA: NEGATIVE
Nitrite, UA: NEGATIVE
PH UA: 6.5 (ref 5.0–7.5)
PROTEIN UA: NEGATIVE
RBC, UA: NEGATIVE
Specific Gravity, UA: 1.022 (ref 1.005–1.030)
Urobilinogen, Ur: 1 mg/dL (ref 0.2–1.0)

## 2016-10-17 LAB — HEPATITIS B SURFACE ANTIGEN: HEP B S AG: NEGATIVE

## 2016-10-17 LAB — VARICELLA ZOSTER ANTIBODY, IGG: Varicella zoster IgG: 345 index (ref >165)

## 2016-10-17 LAB — ABO/RH: RH TYPE: POSITIVE

## 2016-10-17 LAB — RPR: RPR: NONREACTIVE

## 2016-10-17 LAB — RUBELLA SCREEN: RUBELLA: 1.48 {index} (ref >0.99)

## 2016-10-17 LAB — ANTIBODY SCREEN: ANTIBODY SCREEN: NEGATIVE

## 2016-10-18 LAB — URINE CULTURE: Organism ID, Bacteria: NO GROWTH

## 2016-10-21 ENCOUNTER — Telehealth: Payer: Self-pay | Admitting: Women's Health

## 2016-10-21 ENCOUNTER — Encounter: Payer: Self-pay | Admitting: Advanced Practice Midwife

## 2016-10-21 LAB — CYTOLOGY - PAP
Chlamydia: NEGATIVE
DIAGNOSIS: NEGATIVE
HPV: NOT DETECTED
Neisseria Gonorrhea: NEGATIVE

## 2016-10-21 LAB — OB RESULTS CONSOLE GC/CHLAMYDIA: GC PROBE AMP, GENITAL: NEGATIVE

## 2016-10-21 MED ORDER — PRENATAL PLUS 27-1 MG PO TABS: 1.0000 | ORAL_TABLET | Freq: Every day | ORAL | 12 refills | Status: DC

## 2016-10-30 ENCOUNTER — Other Ambulatory Visit: Payer: Medicaid Other

## 2016-10-30 ENCOUNTER — Encounter: Payer: Medicaid Other | Admitting: Obstetrics & Gynecology

## 2016-11-05 ENCOUNTER — Ambulatory Visit (INDEPENDENT_AMBULATORY_CARE_PROVIDER_SITE_OTHER): Payer: Medicaid Other | Admitting: Advanced Practice Midwife

## 2016-11-05 ENCOUNTER — Encounter: Payer: Self-pay | Admitting: Advanced Practice Midwife

## 2016-11-05 VITALS — BP 118/70 | HR 92 | Wt 171.0 lb

## 2016-11-05 DIAGNOSIS — F1920 Other psychoactive substance dependence, uncomplicated: Secondary | ICD-10-CM

## 2016-11-05 DIAGNOSIS — Z131 Encounter for screening for diabetes mellitus: Secondary | ICD-10-CM

## 2016-11-05 DIAGNOSIS — O0993 Supervision of high risk pregnancy, unspecified, third trimester: Secondary | ICD-10-CM | POA: Diagnosis not present

## 2016-11-05 DIAGNOSIS — O0990 Supervision of high risk pregnancy, unspecified, unspecified trimester: Secondary | ICD-10-CM

## 2016-11-05 DIAGNOSIS — F418 Other specified anxiety disorders: Secondary | ICD-10-CM

## 2016-11-05 DIAGNOSIS — Z1389 Encounter for screening for other disorder: Secondary | ICD-10-CM

## 2016-11-05 DIAGNOSIS — Z331 Pregnant state, incidental: Secondary | ICD-10-CM

## 2016-11-05 DIAGNOSIS — F1310 Sedative, hypnotic or anxiolytic abuse, uncomplicated: Secondary | ICD-10-CM

## 2016-11-05 LAB — POCT URINALYSIS DIPSTICK
Glucose, UA: NEGATIVE
Ketones, UA: NEGATIVE
Leukocytes, UA: NEGATIVE
Nitrite, UA: NEGATIVE
RBC UA: NEGATIVE

## 2016-11-05 MED ORDER — NEOMYCIN-POLYMYXIN-HC 3.5-10000-1 OT SOLN: 4.0000 [drp] | Freq: Four times a day (QID) | OTIC | 0 refills | Status: DC

## 2016-11-05 MED ORDER — SERTRALINE HCL 50 MG PO TABS
50.0000 mg | ORAL_TABLET | Freq: Every day | ORAL | 3 refills | Status: DC
Start: 2016-11-05 — End: 2017-01-08

## 2016-11-05 MED ORDER — AMOXICILLIN-POT CLAVULANATE 875-125 MG PO TABS: 1.0000 | ORAL_TABLET | Freq: Two times a day (BID) | ORAL | 0 refills | Status: DC

## 2016-11-05 NOTE — Patient Instructions (Signed)

## 2016-11-05 NOTE — Progress Notes (Signed)
[id number] [redacted]w[redacted]d Estimated Date of Delivery: 01/27/17  Blood pressure 118/70, pulse 92, weight 171 lb (77.6 kg), unknown if currently breastfeeding.   BP weight and urine results all reviewed and noted.  Please refer to the obstetrical flow sheet for the fundal height and fetal heart rate documentation:  Patient reports good fetal movement, denies any bleeding and no rupture of membranes symptoms or regular contractions. Patient has had intense ear pain on Left side for 4-5 days, noticed green drainage, may have had an abscess in outer ear.  Left lymph node swollen and tender.   Going to rehab in one week, probably for 30 days.  Still using crack.  Discussed possibility of abruption/fetal death w/cocaine.   Counselor doesn't want to rx antidepressants while she is pregnanct.  Zoloft has helped in the past, wants to restart it.  Rx  X 1 week, then up to / anticipate needting to increase.   All questions were answered.  Orders Placed This Encounter  Procedures  . Pain Management Screening Profile (10S)  . POCT urinalysis dipstick    Plan:  Continued routine obstetrical care,   Return for asap for PN2 only and 3 weeks for LROB w/MD.if still here.

## 2016-11-06 LAB — PMP SCREEN PROFILE (10S), URINE
AMPHETAMINE SCREEN URINE: NEGATIVE ng/mL
BARBITURATE SCREEN URINE: NEGATIVE ng/mL
BENZODIAZEPINE SCREEN, URINE: POSITIVE ng/mL
CANNABINOIDS UR QL SCN: NEGATIVE ng/mL
Cocaine (Metab) Scrn, Ur: POSITIVE ng/mL
Creatinine(Crt), U: 125.6 mg/dL (ref 20.0–300.0)
METHADONE SCREEN, URINE: NEGATIVE ng/mL
OXYCODONE+OXYMORPHONE UR QL SCN: NEGATIVE ng/mL
Opiate Scrn, Ur: NEGATIVE ng/mL
PH UR, DRUG SCRN: 6.5 (ref 4.5–8.9)
PHENCYCLIDINE QUANTITATIVE URINE: NEGATIVE ng/mL
PROPOXYPHENE SCREEN URINE: NEGATIVE ng/mL

## 2016-11-07 ENCOUNTER — Other Ambulatory Visit: Payer: Medicaid Other

## 2016-11-10 ENCOUNTER — Encounter: Payer: Self-pay | Admitting: *Deleted

## 2016-11-10 ENCOUNTER — Other Ambulatory Visit: Payer: Medicaid Other

## 2016-11-10 ENCOUNTER — Telehealth: Payer: Self-pay | Admitting: *Deleted

## 2016-11-10 NOTE — Telephone Encounter (Signed)
Letter typed, signed and faxed.

## 2016-11-10 NOTE — Telephone Encounter (Signed)
Patient called with complaints of light spotting this morning. She states she walked a lot yesterday. She is only have round ligament pain, not leaking and baby is moving. Advised to call back if bleeding gets heavier, or having more intense pain. Patient verbalized understanding.

## 2016-12-01 ENCOUNTER — Inpatient Hospital Stay (HOSPITAL_COMMUNITY)
Admission: AD | Admit: 2016-12-01 | Discharge: 2016-12-01 | Disposition: A | Discharge status: Home / Self Care | Payer: Medicaid Other | Source: Ambulatory Visit | Attending: Obstetrics and Gynecology | Admitting: Obstetrics and Gynecology

## 2016-12-01 ENCOUNTER — Encounter (HOSPITAL_COMMUNITY): Payer: Self-pay

## 2016-12-01 DIAGNOSIS — Z3A31 31 weeks gestation of pregnancy: Secondary | ICD-10-CM | POA: Diagnosis not present

## 2016-12-01 DIAGNOSIS — F1410 Cocaine abuse, uncomplicated: Secondary | ICD-10-CM | POA: Insufficient documentation

## 2016-12-01 DIAGNOSIS — O99333 Smoking (tobacco) complicating pregnancy, third trimester: Secondary | ICD-10-CM | POA: Insufficient documentation

## 2016-12-01 DIAGNOSIS — Z818 Family history of other mental and behavioral disorders: Secondary | ICD-10-CM | POA: Diagnosis not present

## 2016-12-01 DIAGNOSIS — Z825 Family history of asthma and other chronic lower respiratory diseases: Secondary | ICD-10-CM | POA: Diagnosis not present

## 2016-12-01 DIAGNOSIS — F17210 Nicotine dependence, cigarettes, uncomplicated: Secondary | ICD-10-CM | POA: Insufficient documentation

## 2016-12-01 DIAGNOSIS — O99323 Drug use complicating pregnancy, third trimester: Secondary | ICD-10-CM | POA: Diagnosis not present

## 2016-12-01 DIAGNOSIS — O4703 False labor before 37 completed weeks of gestation, third trimester: Secondary | ICD-10-CM | POA: Insufficient documentation

## 2016-12-01 DIAGNOSIS — O0993 Supervision of high risk pregnancy, unspecified, third trimester: Secondary | ICD-10-CM | POA: Insufficient documentation

## 2016-12-01 DIAGNOSIS — O0990 Supervision of high risk pregnancy, unspecified, unspecified trimester: Secondary | ICD-10-CM

## 2016-12-01 DIAGNOSIS — R109 Unspecified abdominal pain: Secondary | ICD-10-CM | POA: Diagnosis present

## 2016-12-01 LAB — URINALYSIS, ROUTINE W REFLEX MICROSCOPIC
BILIRUBIN URINE: NEGATIVE
Glucose, UA: NEGATIVE mg/dL
Hgb urine dipstick: NEGATIVE
KETONES UR: NEGATIVE mg/dL
Nitrite: NEGATIVE
PH: 6 (ref 5.0–8.0)
Protein, ur: 30 mg/dL — AB
Specific Gravity, Urine: 1.03 (ref 1.005–1.030)

## 2016-12-01 LAB — RAPID URINE DRUG SCREEN, HOSP PERFORMED
AMPHETAMINES: NOT DETECTED
BARBITURATES: NOT DETECTED
BENZODIAZEPINES: NOT DETECTED
Cocaine: POSITIVE — AB
Opiates: POSITIVE — AB
Tetrahydrocannabinol: NOT DETECTED

## 2016-12-01 MED ORDER — NIFEDIPINE 10 MG PO CAPS
10.0000 mg | ORAL_CAPSULE | ORAL | Status: DC | PRN
  Administered 2016-12-01: 10 mg via ORAL
  Filled 2016-12-01: qty 1

## 2016-12-01 MED ORDER — LACTATED RINGERS IV BOLUS (SEPSIS)
1000.0000 mL | Freq: Once | INTRAVENOUS | Status: AC
  Administered 2016-12-01: 1000 mL via INTRAVENOUS

## 2016-12-01 MED ORDER — NIFEDIPINE 10 MG PO CAPS: 10.0000 mg | ORAL_CAPSULE | ORAL | 1 refills | Status: DC | PRN

## 2016-12-01 NOTE — MAU Provider Note (Signed)
Patient Penny KennedyHeather C Grippi is a 31 y.o. [id number] At 4334w6d Here with complaints of abdominal pain. After talking with the patient, the patient admits that she is having contractions but was "too afraid to come to the hospital because she doesn't want the baby to come yet".   Patient is a substance abuser on methadone, and she is working with an Transport planneradoption agency. She is here with her adoption case worker. Patient has not had her suboxone since Friday and has been using drugs all weekend. Patient is very tearful and wants to get into treatment as she is having a hard time staying clean when she is at home.   Patient denies vaginal bleeding, discharge, dysuria, decreased fetal movements, vaginal odor or leaking of fluid.  [facility] is a patient at [facility] and has a prenatal appt on 12-03-2016.  History     CSN: [id number]  Arrival date and time: 12/01/16 1527   First Provider Initiated Contact with Patient 12/01/16 1636      Chief Complaint  Patient presents with  . Abdominal Pain   Abdominal Pain  This is a new problem. The current episode started today. The onset quality is gradual. The problem occurs constantly. The problem has been unchanged. The pain is located in the LLQ, RLQ, LUQ and RUQ. The pain is at a severity of 7/10. The quality of the pain is cramping. The abdominal pain does not radiate. Pertinent negatives include no constipation, diarrhea, headaches, nausea or vomiting. Nothing aggravates the pain. The pain is relieved by nothing. She has tried nothing for the symptoms.    OB History    Gravida Para Term Preterm AB Living   8 5 5   2 5    SAB TAB Ectopic Multiple Live Births   2       5      Past Medical History:  Diagnosis Date  . ADHD (attention deficit hyperactivity disorder)   . Bipolar 1 disorder (HCC)   . Manic-depressive (HCC)   . Polysubstance abuse    cocaine, opiates, benzos, marijuana  . Seizures (HCC)    drug related  . Trichomonal vaginitis     Past Surgical  History:  Procedure Laterality Date  . DILATION AND CURETTAGE OF UTERUS      Family History  Problem Relation Age of Onset  . COPD Mother   . Emphysema Mother   . Mental illness Sister   . Depression Sister   . Mental illness Brother   . Depression Brother   . ADD / ADHD Son     Social History  Substance Use Topics  . Smoking status: Current Every Day Smoker    Packs/day: 1.00    Years: 12.00    Types: Cigarettes  . Smokeless tobacco: Never Used  . Alcohol use Yes     Comment: alcohol use in early pregnancy    Allergies: No Known Allergies  No prescriptions prior to admission.    Review of Systems  Eyes: Negative.   Respiratory: Negative.   Cardiovascular: Negative.   Gastrointestinal: Positive for abdominal pain. Negative for constipation, diarrhea, nausea and vomiting.  Genitourinary: Negative.   Neurological: Negative.  Negative for headaches.   Physical Exam   Blood pressure 121/72, pulse 96, temperature 97.5 F (36.4 C), resp. rate 20, SpO2 98 %, unknown if currently breastfeeding.  Physical Exam  Constitutional: She is oriented to person, place, and time. She appears well-developed and well-nourished.  HENT:  Head: Normocephalic.  Neck: Normal range of  motion.  Respiratory: Effort normal.  GI: Soft.  Contractions palpated mild  Genitourinary:  Genitourinary Comments: NEFG; cervix is long, posterior and fingertip.   Musculoskeletal: Normal range of motion.  Neurological: She is alert and oriented to person, place, and time.  Skin: Skin is warm and dry.  Psychiatric: She has a normal mood and affect.    MAU Course  Procedures  MDM -NST: 150 bpm, irregular contractions, no decels, although at times loss of contact due to maternal positioning, present acels and moderate variability -procardia 10 mg; patient states that her pain is much relieved.  -cervix is unchanged in MAU  -UA-no signs of dehydration -UDS positive for cocaine and opiates -IV  fluids Patient and her case worker are very anxious about finding the patient an alternate source of housing for tonight. She is due back at her methadone clinic tomorrow, and wants to be admitted tonight. I explained that I have no obstetrical reason for admitting her. Calls made to CSW at [facility] and [facility]; no answer. Spoke with Dr. Broadus John at [facility] who said that patient was not a candidate for admission at [facility] due to her recent opiod use.   Patient and her sponsor have worked out an alternate plan for housing tonight and plan to follow up with suboxone clinic in the am and call [facility] for a prenatal visit tomorrow (Tuesday).   Assessment and Plan   1. Preterm labor without delivery, third trimester   2. Supervision of high risk pregnancy, antepartum   3. Cocaine abuse    2. Reviewed warning signs and when patient is to return to MAU (bleeding, increased contractions not relieved by PRN, decreased fetal movements, leaking of fluid).  3. RX for procardia sent; patient plans to pick it up tonight.  4. Patient stable for discharge with case worker  Marylene Land CNM 12/01/2016, 10:13 PM

## 2016-12-01 NOTE — Discharge Instructions (Signed)

## 2016-12-01 NOTE — MAU Note (Signed)
+  abdominal pain on Lower Left side--sharp and constant pain rating 7/10  States is going through detox and needs help/plan for this. Had last dose of medication in methadone clinic on Monday. But wants to make sure everything is okay with baby and the pain.

## 2016-12-02 ENCOUNTER — Telehealth: Payer: Self-pay | Admitting: *Deleted

## 2016-12-02 ENCOUNTER — Telehealth: Payer: Self-pay | Admitting: Advanced Practice Midwife

## 2016-12-02 NOTE — Telephone Encounter (Signed)
Patient called stating she was seen at [facility] for pain and pressure. She stated "I know the baby has dropped but doesn't feel like she is coming out." She is not leaking or bleeding but she is having some pain with urination. Informed patient she could have a UTI. Appointment for tomorrow was cancelled so will put patient back on to be seen. Verbalized understanding.

## 2016-12-03 ENCOUNTER — Ambulatory Visit (INDEPENDENT_AMBULATORY_CARE_PROVIDER_SITE_OTHER): Payer: Medicaid Other | Admitting: Advanced Practice Midwife

## 2016-12-03 ENCOUNTER — Encounter: Payer: Self-pay | Admitting: Advanced Practice Midwife

## 2016-12-03 ENCOUNTER — Encounter: Payer: Medicaid Other | Admitting: Obstetrics and Gynecology

## 2016-12-03 VITALS — BP 120/80 | HR 76 | Wt 172.0 lb

## 2016-12-03 DIAGNOSIS — Z331 Pregnant state, incidental: Secondary | ICD-10-CM | POA: Diagnosis not present

## 2016-12-03 DIAGNOSIS — O0993 Supervision of high risk pregnancy, unspecified, third trimester: Secondary | ICD-10-CM

## 2016-12-03 DIAGNOSIS — Z1389 Encounter for screening for other disorder: Secondary | ICD-10-CM

## 2016-12-03 DIAGNOSIS — O99322 Drug use complicating pregnancy, second trimester: Secondary | ICD-10-CM | POA: Diagnosis not present

## 2016-12-03 DIAGNOSIS — F1410 Cocaine abuse, uncomplicated: Secondary | ICD-10-CM | POA: Diagnosis not present

## 2016-12-03 DIAGNOSIS — Z3A32 32 weeks gestation of pregnancy: Secondary | ICD-10-CM | POA: Diagnosis not present

## 2016-12-03 DIAGNOSIS — O0990 Supervision of high risk pregnancy, unspecified, unspecified trimester: Secondary | ICD-10-CM

## 2016-12-03 LAB — POCT URINALYSIS DIPSTICK
Blood, UA: NEGATIVE
Glucose, UA: NEGATIVE
Ketones, UA: NEGATIVE
LEUKOCYTES UA: NEGATIVE
Nitrite, UA: NEGATIVE

## 2016-12-03 NOTE — Progress Notes (Signed)
Fetal Surveillance Testing today:  NST (decreased FM)   High Risk Pregnancy Diagnosis(es):   Cocaine/opioid abuse  [id number] 1014w1d Estimated Date of Delivery: 01/27/17  Blood pressure 120/80, pulse 76, weight 172 lb (78 kg), unknown if currently breastfeeding.  Urinalysis: Negative   HPI: The patient is being seen today for ongoing management of the above.  . Today she reports she did not go to rehab (couldn't get a ride). Has appt today at [facility] for suboxone and counseling.  Encouraged to try to work out a ride for rehab tomorrow.  Pt has been told multiple times that cocaine can cuase abruption/IUFD   BP weight and urine results all reviewed and noted. Patient reports good fetal movement, denies any bleeding and no rupture of membranes symptoms or regular contractions.  Fundal Height:  32 Fetal Heart rate:  Reactive NST Edema:  no  Patient is without complaints other than noted in her HPI. All questions were answered.  All lab and sonogram results have been reviewed. Comments:  Still needs GTT--couldn't do today d/t [facility]  Assessment:  1.  Pregnancy at 714w1d,  Estimated Date of Delivery: 01/27/17 :                          2.  Polysubstance abuse                        3.    Medication(s) Plans:  suboxone as rx'd by clinic, but she doesn't think they will give it to her anymore because she keeps relapsing.    Treatment Plan:  GTT asap; hopefullly will get a ride to rehab tomorrow  Return in about 2 weeks (around 12/17/2016) for HROB, PN2 (may do 1 hour gtt if that's all pt can do. for appointment for high risk OB care  No orders of the defined types were placed in this encounter.  Orders Placed This Encounter  Procedures  . POCT urinalysis dipstick

## 2016-12-03 NOTE — Patient Instructions (Signed)

## 2016-12-08 ENCOUNTER — Telehealth: Payer: Self-pay | Admitting: *Deleted

## 2016-12-08 NOTE — Telephone Encounter (Signed)
Patient called stating she was given an antibiotic for an ear infection 2 weeks ago. She thinks it is back and [facility] [place].  Please advise.

## 2016-12-08 NOTE — Telephone Encounter (Signed)
LMOVM that she needed an appointment to be seen.

## 2016-12-09 ENCOUNTER — Other Ambulatory Visit: Payer: Self-pay | Admitting: Advanced Practice Midwife

## 2016-12-09 MED ORDER — AMOXICILLIN-POT CLAVULANATE 875-125 MG PO TABS: 1.0000 | ORAL_TABLET | Freq: Two times a day (BID) | ORAL | 0 refills | Status: DC

## 2016-12-09 NOTE — Telephone Encounter (Signed)
Patient called stating she is still having the same symptoms as last time when she had an ear infection, pain and swelling. Wants another antibiotic or does she need to be evaluated again? Please advise.

## 2016-12-09 NOTE — Telephone Encounter (Signed)
I sent it in 

## 2016-12-09 NOTE — Telephone Encounter (Signed)
LMOVM that prescription was sent to pharmacy.  

## 2016-12-09 NOTE — Progress Notes (Signed)
augmentin for ear infection

## 2016-12-17 ENCOUNTER — Other Ambulatory Visit: Payer: Medicaid Other

## 2016-12-17 ENCOUNTER — Encounter: Payer: Medicaid Other | Admitting: Obstetrics and Gynecology

## 2016-12-24 LAB — OB RESULTS CONSOLE GC/CHLAMYDIA
CHLAMYDIA, DNA PROBE: NEGATIVE
GC PROBE AMP, GENITAL: NEGATIVE

## 2016-12-24 NOTE — Telephone Encounter (Signed)
closed

## 2017-01-05 ENCOUNTER — Encounter: Payer: Medicaid Other | Admitting: Obstetrics and Gynecology

## 2017-01-06 ENCOUNTER — Encounter: Payer: Self-pay | Admitting: *Deleted

## 2017-01-08 ENCOUNTER — Encounter: Payer: Self-pay | Admitting: Obstetrics and Gynecology

## 2017-01-08 ENCOUNTER — Ambulatory Visit (INDEPENDENT_AMBULATORY_CARE_PROVIDER_SITE_OTHER): Payer: Medicaid Other | Admitting: Obstetrics and Gynecology

## 2017-01-08 VITALS — BP 128/60 | HR 100 | Wt 183.6 lb

## 2017-01-08 DIAGNOSIS — F1911 Other psychoactive substance abuse, in remission: Secondary | ICD-10-CM

## 2017-01-08 DIAGNOSIS — O99323 Drug use complicating pregnancy, third trimester: Secondary | ICD-10-CM

## 2017-01-08 DIAGNOSIS — Z331 Pregnant state, incidental: Secondary | ICD-10-CM

## 2017-01-08 DIAGNOSIS — Z87898 Personal history of other specified conditions: Secondary | ICD-10-CM | POA: Diagnosis not present

## 2017-01-08 DIAGNOSIS — Z1389 Encounter for screening for other disorder: Secondary | ICD-10-CM

## 2017-01-08 DIAGNOSIS — O0993 Supervision of high risk pregnancy, unspecified, third trimester: Secondary | ICD-10-CM

## 2017-01-08 DIAGNOSIS — O0990 Supervision of high risk pregnancy, unspecified, unspecified trimester: Secondary | ICD-10-CM

## 2017-01-08 LAB — POCT URINALYSIS DIPSTICK
Blood, UA: NEGATIVE
Glucose, UA: NEGATIVE
Ketones, UA: NEGATIVE
Leukocytes, UA: NEGATIVE
NITRITE UA: NEGATIVE
PROTEIN UA: NEGATIVE

## 2017-01-08 NOTE — Progress Notes (Signed)
High Risk Pregnancy HROB Diagnosis(es):   Recurrent drug abuse and suboxone use   [id number] 3231w2d Estimated Date of Delivery: 01/27/17    HPI: The patient is being seen today for ongoing management of above. Patient started her pregnancy care with this office but [place] drug rehabilitation. She now returns to continue pregnancy care for the remainder of this pregnancy. She was released yesterday. She is now living alone in an apartment; she has her son part time. She does not have transportation but stays with a cousin on days of appointments so she can get a ride. She states she does not have drug dealers that are walking distance from her apartment; states she does not have anyone approaching her and will not unless she begins to reach out first.  Today she has no complaints.  Patient reports good fetal movement, denies any bleeding and no rupture of membranes symptoms or regular contractions.   BP weight and urine results reviewed and noted. Blood pressure 128/60, pulse 100, weight 183 lb 9.6 oz (83.3 kg), unknown if currently breastfeeding.  Fundal Height:  37 Fetal Heart rate: 110 reactive. Physical Examination: Abdomen - soft, nontender, nondistended, no masses or organomegaly                                     Pelvic - examination not indicated                                     Edema:  trace  Urinalysis:NEGATIVE   Fetal Surveillance Testing today:  last testing was 01/06/17 NST  Lab and sonogram results have been reviewed. Comments:   Assessment:  1.  Pregnancy at 5231w2d,  [id number]   :  Estimated Date of Delivery: 01/27/17                        2.  Recurrent drug abuse, [facility]                        3. Suboxone use 8 mg q 12 h managed by __ Dr Loraine LericheMark Sch___ at [phone]. Has Rx til Wednesday.  Medication(s) Plans:  Suboxone use followed by Dr Loraine LericheMark Sch____ provider in [place]  Treatment Plan:  NST today; twice weekly testing for remainder of pregnancy, alternating NST and BPP  Follow up in 4 days for appointment for high risk OB care,   By signing my name below, I, Penny Hale, attest that this documentation has been prepared under the direction and in the presence of Tilda BurrowFerguson, Ajani Schnieders V, MD. Electronically Signed: Sonum Hale, Neurosurgeoncribe. 01/08/17. 12:33 PM.  I personally performed the services described in this documentation, which was SCRIBED in my presence. The recorded information has been reviewed and considered accurate. It has been edited as necessary during review. Tilda BurrowFERGUSON,Eames Dibiasio V, MD

## 2017-01-09 LAB — PMP SCREEN PROFILE (10S), URINE
Amphetamine Scrn, Ur: NEGATIVE ng/mL
BARBITURATE SCREEN URINE: NEGATIVE ng/mL
BENZODIAZEPINE SCREEN, URINE: NEGATIVE ng/mL
CANNABINOIDS UR QL SCN: NEGATIVE ng/mL
Cocaine (Metab) Scrn, Ur: NEGATIVE ng/mL
Creatinine(Crt), U: 91 mg/dL (ref 20.0–300.0)
Methadone Screen, Urine: NEGATIVE ng/mL
OXYCODONE+OXYMORPHONE UR QL SCN: NEGATIVE ng/mL
Opiate Scrn, Ur: NEGATIVE ng/mL
Ph of Urine: 6 (ref 4.5–8.9)
Phencyclidine Qn, Ur: NEGATIVE ng/mL
Propoxyphene Scrn, Ur: NEGATIVE ng/mL

## 2017-01-12 ENCOUNTER — Other Ambulatory Visit: Payer: Medicaid Other | Admitting: Obstetrics & Gynecology

## 2017-01-13 ENCOUNTER — Other Ambulatory Visit: Payer: Medicaid Other | Admitting: Obstetrics & Gynecology

## 2017-01-15 ENCOUNTER — Ambulatory Visit (INDEPENDENT_AMBULATORY_CARE_PROVIDER_SITE_OTHER): Payer: Medicaid Other | Admitting: Women's Health

## 2017-01-15 ENCOUNTER — Encounter: Payer: Self-pay | Admitting: Women's Health

## 2017-01-15 VITALS — BP 98/50 | HR 88 | Wt 181.0 lb

## 2017-01-15 DIAGNOSIS — O0993 Supervision of high risk pregnancy, unspecified, third trimester: Secondary | ICD-10-CM | POA: Diagnosis not present

## 2017-01-15 DIAGNOSIS — Z3A38 38 weeks gestation of pregnancy: Secondary | ICD-10-CM

## 2017-01-15 DIAGNOSIS — O99323 Drug use complicating pregnancy, third trimester: Secondary | ICD-10-CM | POA: Diagnosis not present

## 2017-01-15 DIAGNOSIS — O0990 Supervision of high risk pregnancy, unspecified, unspecified trimester: Secondary | ICD-10-CM

## 2017-01-15 DIAGNOSIS — Z720 Tobacco use: Secondary | ICD-10-CM | POA: Diagnosis not present

## 2017-01-15 DIAGNOSIS — Z3483 Encounter for supervision of other normal pregnancy, third trimester: Secondary | ICD-10-CM

## 2017-01-15 DIAGNOSIS — Z331 Pregnant state, incidental: Secondary | ICD-10-CM | POA: Diagnosis not present

## 2017-01-15 DIAGNOSIS — O99333 Smoking (tobacco) complicating pregnancy, third trimester: Secondary | ICD-10-CM | POA: Diagnosis not present

## 2017-01-15 DIAGNOSIS — Z1389 Encounter for screening for other disorder: Secondary | ICD-10-CM

## 2017-01-15 DIAGNOSIS — F1911 Other psychoactive substance abuse, in remission: Secondary | ICD-10-CM

## 2017-01-15 DIAGNOSIS — F418 Other specified anxiety disorders: Secondary | ICD-10-CM

## 2017-01-15 DIAGNOSIS — F1910 Other psychoactive substance abuse, uncomplicated: Secondary | ICD-10-CM

## 2017-01-15 LAB — POCT URINALYSIS DIPSTICK
Blood, UA: NEGATIVE
GLUCOSE UA: NEGATIVE
Ketones, UA: NEGATIVE
Nitrite, UA: NEGATIVE
Protein, UA: NEGATIVE

## 2017-01-15 MED ORDER — PANTOPRAZOLE SODIUM 20 MG PO TBEC: 20.0000 mg | DELAYED_RELEASE_TABLET | Freq: Every day | ORAL | 0 refills | Status: DC

## 2017-01-15 MED ORDER — NICOTINE 21 MG/24HR TD PT24: 21.0000 mg | MEDICATED_PATCH | Freq: Every day | TRANSDERMAL | 1 refills | Status: DC

## 2017-01-15 NOTE — Progress Notes (Signed)
High Risk Pregnancy Diagnosis(es): polysubstance abuse [id number] 8579w2d Estimated Date of Delivery: 01/27/17 BP (!) 98/50   Pulse 88   Wt 181 lb (82.1 kg)   LMP  (LMP Unknown)   BMI 32.06 kg/m   Urinalysis: Positive for 1+ leuks HPI:  Doing well. Clean x 31days! On wellbutrin to help w/ smoking and depression. Subutex was increased this week from 8mg  BID to 8mg  TID- pt states she tried to tell provider she just needed to change times she was taking it (9a  & 4p) b/c would wake up at 0100 w/ sx. She is not sure she wants to add a 3rd dose b/c she doesn't want baby getting even more. States she will talk w/ him about this. Requests nicotine patch- states she had it while in rehab along w/ the wellbutrin and it did great. She is currently smoking 1ppd. Rx nicotine patch 21mg  daily- DO NOT smoke w/ patch. Requests meds for reflux- protonix 20mg  sent. States she had u/s while in rehab and was told fluid was more than what it should be and femur was measuring short. Has not had u/s here since anatomy u/s. Never came for pn2 x 2 or 1hr glucola.  BP, weight, and urine reviewed.  Reports decreased fm. Denies regular uc's, lof, vb, uti s/s. No complaints.  Fundal Height:  38 Fetal Heart rate:  135, reactive NST Edema:  none GBS, gc/ct collected SVE: 1/th/-3, vtx  Reviewed labor s/s, fkc All questions were answered Assessment: 6879w2d polysubstance abuse Medication(s) Plans:  subutex 8mg  bid/tid (whatever they decide on when she talks to provider) Treatment Plan:  2x/wk testing, bpp/dopp/efw/afi u/s on Monday Follow up on Monday for high-risk OB appt and bpp/dopp/efw/afi u/s UDS today

## 2017-01-15 NOTE — Patient Instructions (Signed)
Call the office ([phone]) or go to [facility] if:  You begin to have strong, frequent contractions  Your water breaks.  Sometimes it is a big gush of fluid, sometimes it is just a trickle that keeps getting your panties wet or running down your legs  You have vaginal bleeding.  It is normal to have a small amount of spotting if your cervix was checked.   You don't feel your baby moving like normal.  If you don't, get you something to eat and drink and lay down and focus on feeling your baby move.  You should feel at least 10 movements in 2 hours.  If you don't, you should call the office or go to [facility].     Braxton Hicks Contractions Contractions of the uterus can occur throughout pregnancy, but they are not always a sign that you are in labor. You may have practice contractions called Braxton Hicks contractions. These false labor contractions are sometimes confused with true labor. What are Braxton Hicks contractions? Braxton Hicks contractions are tightening movements that occur in the muscles of the uterus before labor. Unlike true labor contractions, these contractions do not result in opening (dilation) and thinning of the cervix. Toward the end of pregnancy (32-34 weeks), Braxton Hicks contractions can happen more often and may become stronger. These contractions are sometimes difficult to tell apart from true labor because they can be very uncomfortable. You should not feel embarrassed if you go to the hospital with false labor. Sometimes, the only way to tell if you are in true labor is for your health care provider to look for changes in the cervix. The health care provider will do a physical exam and may monitor your contractions. If you are not in true labor, the exam should show that your cervix is not dilating and your water has not broken. If there are no prenatal problems or other health problems associated with your pregnancy, it is completely safe for you to be sent  home with false labor. You may continue to have Braxton Hicks contractions until you go into true labor. How can I tell the difference between true labor and false labor?  Differences ? False labor ? Contractions last 30-70 seconds.: Contractions are usually shorter and not as strong as true labor contractions. ? Contractions become very regular.: Contractions are usually irregular. ? Discomfort is usually felt in the top of the uterus, and it spreads to the lower abdomen and low back.: Contractions are often felt in the front of the lower abdomen and in the groin. ? Contractions do not go away with walking.: Contractions may go away when you walk around or change positions while lying down. ? Contractions usually become more intense and increase in frequency.: Contractions get weaker and are shorter-lasting as time goes on. ? The cervix dilates and gets thinner.: The cervix usually does not dilate or become thin. Follow these instructions at home:  Take over-the-counter and prescription medicines only as told by your health care provider.  Keep up with your usual exercises and follow other instructions from your health care provider.  Eat and drink lightly if you think you are going into labor.  If Braxton Hicks contractions are making you uncomfortable: ? Change your position from lying down or resting to walking, or change from walking to resting. ? Sit and rest in a tub of warm water. ? Drink enough fluid to keep your urine clear or pale yellow. Dehydration may cause these contractions. ?   Do slow and deep breathing several times an hour.  Keep all follow-up prenatal visits as told by your health care provider. This is important. Contact a health care provider if:  You have a fever.  You have continuous pain in your abdomen. Get help right away if:  Your contractions become stronger, more regular, and closer together.  You have fluid leaking or gushing from your vagina.  You  pass blood-tinged mucus (bloody show).  You have bleeding from your vagina.  You have low back pain that you never had before.  You feel your baby's head pushing down and causing pelvic pressure.  Your baby is not moving inside you as much as it used to. Summary  Contractions that occur before labor are called Braxton Hicks contractions, false labor, or practice contractions.  Braxton Hicks contractions are usually shorter, weaker, farther apart, and less regular than true labor contractions. True labor contractions usually become progressively stronger and regular and they become more frequent.  Manage discomfort from Braxton Hicks contractions by changing position, resting in a warm bath, drinking plenty of water, or practicing deep breathing. This information is not intended to replace advice given to you by your health care provider. Make sure you discuss any questions you have with your health care provider. Document Released: 06/23/2005 Document Revised: 05/12/2016 Document Reviewed: 05/12/2016 Elsevier Interactive Patient Education  2017 [employer]  

## 2017-01-16 LAB — PMP SCREEN PROFILE (10S), URINE
AMPHETAMINE SCREEN URINE: NEGATIVE ng/mL
BARBITURATE SCREEN URINE: NEGATIVE ng/mL
BENZODIAZEPINE SCREEN, URINE: NEGATIVE ng/mL
CANNABINOIDS UR QL SCN: NEGATIVE ng/mL
COCAINE(METAB.)SCREEN, URINE: NEGATIVE ng/mL
Creatinine(Crt), U: 34.9 mg/dL (ref 20.0–300.0)
Methadone Screen, Urine: NEGATIVE ng/mL
OPIATE SCREEN URINE: NEGATIVE ng/mL
OXYCODONE+OXYMORPHONE UR QL SCN: NEGATIVE ng/mL
PHENCYCLIDINE QUANTITATIVE URINE: NEGATIVE ng/mL
PROPOXYPHENE SCREEN URINE: NEGATIVE ng/mL
Ph of Urine: 8 (ref 4.5–8.9)

## 2017-01-16 LAB — OB RESULTS CONSOLE GBS: GBS: NEGATIVE

## 2017-01-17 LAB — GC/CHLAMYDIA PROBE AMP
Chlamydia trachomatis, NAA: NEGATIVE
Neisseria gonorrhoeae by PCR: NEGATIVE

## 2017-01-17 LAB — STREP GP B NAA: Strep Gp B NAA: NEGATIVE

## 2017-01-19 ENCOUNTER — Encounter: Payer: Self-pay | Admitting: Obstetrics and Gynecology

## 2017-01-19 ENCOUNTER — Ambulatory Visit (INDEPENDENT_AMBULATORY_CARE_PROVIDER_SITE_OTHER): Payer: Medicaid Other

## 2017-01-19 ENCOUNTER — Ambulatory Visit (INDEPENDENT_AMBULATORY_CARE_PROVIDER_SITE_OTHER): Payer: Medicaid Other | Admitting: Obstetrics and Gynecology

## 2017-01-19 VITALS — BP 112/60 | HR 86 | Wt 180.0 lb

## 2017-01-19 DIAGNOSIS — O09893 Supervision of other high risk pregnancies, third trimester: Secondary | ICD-10-CM

## 2017-01-19 DIAGNOSIS — O99323 Drug use complicating pregnancy, third trimester: Secondary | ICD-10-CM | POA: Diagnosis not present

## 2017-01-19 DIAGNOSIS — Z1389 Encounter for screening for other disorder: Secondary | ICD-10-CM | POA: Diagnosis not present

## 2017-01-19 DIAGNOSIS — Z331 Pregnant state, incidental: Secondary | ICD-10-CM

## 2017-01-19 DIAGNOSIS — O0990 Supervision of high risk pregnancy, unspecified, unspecified trimester: Secondary | ICD-10-CM

## 2017-01-19 DIAGNOSIS — F1410 Cocaine abuse, uncomplicated: Secondary | ICD-10-CM

## 2017-01-19 DIAGNOSIS — Z3A38 38 weeks gestation of pregnancy: Secondary | ICD-10-CM | POA: Diagnosis not present

## 2017-01-19 DIAGNOSIS — F1910 Other psychoactive substance abuse, uncomplicated: Secondary | ICD-10-CM

## 2017-01-19 DIAGNOSIS — F1920 Other psychoactive substance dependence, uncomplicated: Secondary | ICD-10-CM

## 2017-01-19 DIAGNOSIS — Z3483 Encounter for supervision of other normal pregnancy, third trimester: Secondary | ICD-10-CM

## 2017-01-19 LAB — POCT URINALYSIS DIPSTICK
Blood, UA: NEGATIVE
GLUCOSE UA: NEGATIVE
Nitrite, UA: NEGATIVE
Protein, UA: NEGATIVE

## 2017-01-19 LAB — GLUCOSE, POCT (MANUAL RESULT ENTRY): POC Glucose: 113 mg/dl — AB (ref 70–99)

## 2017-01-19 NOTE — Progress Notes (Signed)
High Risk Pregnancy HROB Diagnosis(es):   Polysubstance abuse  [id number] 2571w6d Estimated Date of Delivery: 01/27/17    HPI: The patient is being seen today for ongoing management of polysubstance abuse. Today she reports feeling back pain and pressure last night, but reports no other complaints. Patient reports good fetal movement, denies any bleeding and no rupture of membranes symptoms or regular contractions.   BP weight and urine results reviewed and noted. Blood pressure 112/60, pulse 86, weight 180 lb (81.6 kg), unknown if currently breastfeeding.  Fundal Height:  38 cm Fetal Heart rate:  133 bpm Physical Examination: Abdomen - soft, nontender, nondistended, no masses or organomegaly                                     Pelvic - not done                                     Edema:  negative  Urinalysis: not done  Fetal Surveillance Testing today:  BPP/Dopp/EFW/AFI u/s  Lab and sonogram results have been reviewed.   Assessment:  1.  Pregnancy at 1471w6d,  [id number]   :  Estimated Date of Delivery: 01/27/17                        2.  Polysubstance abuse                        3   biophysical profile 8/8  Medication(s) Plans:  subutex 8mg  bid/tid  Treatment Plan: 2x/week testing, bpp/dopp//EFW/AFI u/s  Follow up in 2 -3 days for appointment for high risk OB care, polysubstance abuse note patient cannot get transportation at 3 days .   By signing my name below, I, Izna Ahmed, attest that this documentation has been prepared under the direction and in the presence of Tilda BurrowFerguson, Dekota Shenk V, MD. Electronically Signed: Redge GainerIzna Ahmed, ED Scribe. 01/19/17. 2:27 PM.  I personally performed the services described in this documentation, which was SCRIBED in my presence. The recorded information has been reviewed and considered accurate. It has been edited as necessary during review. Tilda BurrowFERGUSON,Argelia Formisano V, MD

## 2017-01-19 NOTE — Progress Notes (Addendum)
US 38+6 wks,cephalic,ant pl gr 3,BPP 8/8,AFI 15 cm,normal ovaries bilat,fhr 133 bpm,RI .54,.54=47%,S/D 2,efw 3396 g 54%

## 2017-01-21 ENCOUNTER — Other Ambulatory Visit: Payer: Medicaid Other

## 2017-01-21 ENCOUNTER — Other Ambulatory Visit: Payer: Medicaid Other | Admitting: Obstetrics and Gynecology

## 2017-01-21 ENCOUNTER — Ambulatory Visit (INDEPENDENT_AMBULATORY_CARE_PROVIDER_SITE_OTHER): Payer: Medicaid Other | Admitting: Obstetrics and Gynecology

## 2017-01-21 ENCOUNTER — Encounter: Payer: Self-pay | Admitting: Obstetrics and Gynecology

## 2017-01-21 VITALS — BP 110/58 | HR 84 | Wt 181.4 lb

## 2017-01-21 DIAGNOSIS — Z3A39 39 weeks gestation of pregnancy: Secondary | ICD-10-CM

## 2017-01-21 DIAGNOSIS — F1910 Other psychoactive substance abuse, uncomplicated: Secondary | ICD-10-CM

## 2017-01-21 DIAGNOSIS — Z1389 Encounter for screening for other disorder: Secondary | ICD-10-CM

## 2017-01-21 DIAGNOSIS — O99323 Drug use complicating pregnancy, third trimester: Secondary | ICD-10-CM | POA: Diagnosis not present

## 2017-01-21 DIAGNOSIS — Z331 Pregnant state, incidental: Secondary | ICD-10-CM

## 2017-01-21 DIAGNOSIS — O09893 Supervision of other high risk pregnancies, third trimester: Secondary | ICD-10-CM

## 2017-01-21 DIAGNOSIS — O0990 Supervision of high risk pregnancy, unspecified, unspecified trimester: Secondary | ICD-10-CM

## 2017-01-21 LAB — POCT URINALYSIS DIPSTICK
Glucose, UA: NEGATIVE
KETONES UA: NEGATIVE
NITRITE UA: NEGATIVE
Protein, UA: NEGATIVE
RBC UA: NEGATIVE

## 2017-01-21 NOTE — Progress Notes (Addendum)
High Risk Pregnancy HROB Diagnosis(es):   Polysubstance abuse  [id number] 8057w1d Estimated Date of Delivery: 01/27/17    HPI: The patient is being seen today for ongoing management of polysubstance abuse. Today she reports no complaints Patient reports good fetal movement, denies any bleeding and no rupture of membranes symptoms or regular contractions.   BP weight and urine results reviewed and noted. Blood pressure (!) 110/58, pulse 84, weight 181 lb 6.4 oz (82.3 kg), unknown if currently breastfeeding.  Fundal Height:  38cm Fetal Heart rate:  130bpm Physical Examination: Abdomen - soft, nontender, nondistended, no masses or organomegaly                                     Pelvic - 1 cm cervix                                     Edema:  Not indicated  Urinalysis: trace leukocytes  Fetal Surveillance Testing today:  NST Lab and sonogram results have been reviewed.   Assessment:  1.  Pregnancy at 5057w1d,  [id number]   :  Estimated Date of Delivery: 01/27/17                        2.  HROB - polysubstance abuse                         Medication(s) Plans:  subutex 8mg  bid/tid  Treatment Plan:  2x/week testing, bpp/dopp//EFW/AFI u/s  Follow up in 5 days for appointment for high risk OB care, polysubstance abuse  By signing my name below, I, Izna Ahmed, attest that this documentation has been prepared under the direction and in the presence of Tilda BurrowFerguson, Arika Mainer V, MD. Electronically Signed: Redge GainerIzna Ahmed, ED Scribe. 01/21/17. 2:43 PM.  I personally performed the services described in this documentation, which was SCRIBED in my presence. The recorded information has been reviewed and considered accurate. It has been edited as necessary during review. Tilda BurrowFERGUSON,Hazelene Doten V, MD

## 2017-01-23 ENCOUNTER — Other Ambulatory Visit: Payer: Self-pay | Admitting: Obstetrics and Gynecology

## 2017-01-23 DIAGNOSIS — F1910 Other psychoactive substance abuse, uncomplicated: Secondary | ICD-10-CM

## 2017-01-26 ENCOUNTER — Telehealth (HOSPITAL_COMMUNITY): Payer: Self-pay | Admitting: *Deleted

## 2017-01-26 ENCOUNTER — Other Ambulatory Visit: Payer: Medicaid Other

## 2017-01-26 ENCOUNTER — Ambulatory Visit (INDEPENDENT_AMBULATORY_CARE_PROVIDER_SITE_OTHER): Payer: Medicaid Other | Admitting: Obstetrics and Gynecology

## 2017-01-26 ENCOUNTER — Ambulatory Visit (INDEPENDENT_AMBULATORY_CARE_PROVIDER_SITE_OTHER): Payer: Medicaid Other

## 2017-01-26 ENCOUNTER — Ambulatory Visit (INDEPENDENT_AMBULATORY_CARE_PROVIDER_SITE_OTHER): Payer: Medicaid Other | Admitting: Obstetrics & Gynecology

## 2017-01-26 ENCOUNTER — Encounter: Payer: Self-pay | Admitting: Obstetrics & Gynecology

## 2017-01-26 VITALS — BP 100/60 | HR 74 | Wt 179.0 lb

## 2017-01-26 DIAGNOSIS — O99323 Drug use complicating pregnancy, third trimester: Secondary | ICD-10-CM

## 2017-01-26 DIAGNOSIS — F1910 Other psychoactive substance abuse, uncomplicated: Secondary | ICD-10-CM

## 2017-01-26 DIAGNOSIS — Z331 Pregnant state, incidental: Secondary | ICD-10-CM | POA: Diagnosis not present

## 2017-01-26 DIAGNOSIS — O0990 Supervision of high risk pregnancy, unspecified, unspecified trimester: Secondary | ICD-10-CM

## 2017-01-26 DIAGNOSIS — Z3483 Encounter for supervision of other normal pregnancy, third trimester: Secondary | ICD-10-CM

## 2017-01-26 DIAGNOSIS — Z1389 Encounter for screening for other disorder: Secondary | ICD-10-CM | POA: Diagnosis not present

## 2017-01-26 DIAGNOSIS — O09893 Supervision of other high risk pregnancies, third trimester: Secondary | ICD-10-CM | POA: Diagnosis not present

## 2017-01-26 DIAGNOSIS — Z3A39 39 weeks gestation of pregnancy: Secondary | ICD-10-CM

## 2017-01-26 DIAGNOSIS — F1410 Cocaine abuse, uncomplicated: Secondary | ICD-10-CM

## 2017-01-26 DIAGNOSIS — F1920 Other psychoactive substance dependence, uncomplicated: Secondary | ICD-10-CM

## 2017-01-26 NOTE — Progress Notes (Signed)
US 39+6 wks,cephalic,ant pl gr 3,fhr 143 bpm,BPP 8/8,AFI 14.5 cm,RI .58,.57=69%,EFW 3612 g 63%

## 2017-01-26 NOTE — Progress Notes (Signed)
Penny Hale is a 31 y.o. female High Risk Pregnancy HROB Diagnosis(es): Polysubstance abuse  [phone] 446w6d Estimated Date of Delivery: 01/27/17    HPI: TBy signing my name below, I, Izna Ahmed, attest that this documentation has been prepared under the direction and in the presence of Tilda BurrowFerguson, Desirae Mancusi V, MD. Electronically Signed: Redge GainerIzna Ahmed, ED Scribe. 01/26/17. 9:56 AM.  Leretha Dykesancel the appointment seen by Dr. Despina Hiddeneure the next day

## 2017-01-26 NOTE — Progress Notes (Signed)
[id number] 4484w6d Estimated Date of Delivery: 01/27/17  Blood pressure 100/60, pulse 74, weight 179 lb (81.2 kg), unknown if currently breastfeeding.   BP weight and urine results all reviewed and noted.  Please refer to the obstetrical flow sheet for the fundal height and fetal heart rate documentation:  Patient reports good fetal movement, denies any bleeding and no rupture of membranes symptoms or regular contractions. Patient is without complaints. All questions were answered.  Orders Placed This Encounter  Procedures  . POCT urinalysis dipstick    Plan:  Continued routine obstetrical care, induction of labor 02/03/2017 0700 scheduled Wants pp BTL  Sonogram is normal report done  Return in about 6 weeks (around 03/09/2017) for post partum visit.

## 2017-01-26 NOTE — Telephone Encounter (Signed)
Preadmission screen  

## 2017-01-27 ENCOUNTER — Telehealth (HOSPITAL_COMMUNITY): Payer: Self-pay | Admitting: *Deleted

## 2017-01-27 ENCOUNTER — Encounter (HOSPITAL_COMMUNITY): Payer: Self-pay | Admitting: *Deleted

## 2017-01-27 NOTE — Telephone Encounter (Signed)
Preadmission screen  

## 2017-01-28 ENCOUNTER — Other Ambulatory Visit: Payer: Self-pay | Admitting: Advanced Practice Midwife

## 2017-01-30 ENCOUNTER — Telehealth: Payer: Self-pay | Admitting: Obstetrics & Gynecology

## 2017-01-30 ENCOUNTER — Other Ambulatory Visit: Payer: Self-pay | Admitting: Obstetrics & Gynecology

## 2017-01-30 MED ORDER — BUPROPION HCL 100 MG PO TABS: 100.0000 mg | ORAL_TABLET | Freq: Two times a day (BID) | ORAL | 2 refills | Status: AC

## 2017-01-30 MED ORDER — PRENATAL VITAMIN 27-0.8 MG PO TABS: 1.0000 | ORAL_TABLET | Freq: Every day | ORAL | 11 refills | Status: AC

## 2017-01-30 NOTE — Telephone Encounter (Signed)
LMOVM that refill request had been sent to a provider.

## 2017-02-02 ENCOUNTER — Encounter (HOSPITAL_COMMUNITY): Payer: Self-pay | Admitting: *Deleted

## 2017-02-02 ENCOUNTER — Inpatient Hospital Stay (HOSPITAL_COMMUNITY): Payer: Medicaid Other | Admitting: Anesthesiology

## 2017-02-02 ENCOUNTER — Inpatient Hospital Stay (HOSPITAL_COMMUNITY)
Admission: AD | Admit: 2017-02-02 | Discharge: 2017-02-08 | DRG: 767 | Disposition: A | Discharge status: Home / Self Care | Payer: Medicaid Other | Source: Ambulatory Visit | Attending: Obstetrics & Gynecology | Admitting: Obstetrics & Gynecology

## 2017-02-02 DIAGNOSIS — F1910 Other psychoactive substance abuse, uncomplicated: Secondary | ICD-10-CM | POA: Diagnosis present

## 2017-02-02 DIAGNOSIS — F17210 Nicotine dependence, cigarettes, uncomplicated: Secondary | ICD-10-CM | POA: Diagnosis present

## 2017-02-02 DIAGNOSIS — O99334 Smoking (tobacco) complicating childbirth: Secondary | ICD-10-CM | POA: Diagnosis present

## 2017-02-02 DIAGNOSIS — M549 Dorsalgia, unspecified: Secondary | ICD-10-CM | POA: Diagnosis not present

## 2017-02-02 DIAGNOSIS — O9972 Diseases of the skin and subcutaneous tissue complicating childbirth: Secondary | ICD-10-CM | POA: Diagnosis present

## 2017-02-02 DIAGNOSIS — F1994 Other psychoactive substance use, unspecified with psychoactive substance-induced mood disorder: Secondary | ICD-10-CM

## 2017-02-02 DIAGNOSIS — L02212 Cutaneous abscess of back [any part, except buttock]: Secondary | ICD-10-CM | POA: Diagnosis not present

## 2017-02-02 DIAGNOSIS — O99344 Other mental disorders complicating childbirth: Secondary | ICD-10-CM | POA: Diagnosis present

## 2017-02-02 DIAGNOSIS — Z3A40 40 weeks gestation of pregnancy: Secondary | ICD-10-CM

## 2017-02-02 DIAGNOSIS — Z3A41 41 weeks gestation of pregnancy: Secondary | ICD-10-CM

## 2017-02-02 DIAGNOSIS — O9962 Diseases of the digestive system complicating childbirth: Secondary | ICD-10-CM | POA: Diagnosis present

## 2017-02-02 DIAGNOSIS — M5126 Other intervertebral disc displacement, lumbar region: Secondary | ICD-10-CM | POA: Diagnosis present

## 2017-02-02 DIAGNOSIS — F419 Anxiety disorder, unspecified: Secondary | ICD-10-CM | POA: Diagnosis present

## 2017-02-02 DIAGNOSIS — L0390 Cellulitis, unspecified: Secondary | ICD-10-CM | POA: Diagnosis not present

## 2017-02-02 DIAGNOSIS — Z302 Encounter for sterilization: Secondary | ICD-10-CM | POA: Diagnosis not present

## 2017-02-02 DIAGNOSIS — O0990 Supervision of high risk pregnancy, unspecified, unspecified trimester: Secondary | ICD-10-CM

## 2017-02-02 DIAGNOSIS — F1310 Sedative, hypnotic or anxiolytic abuse, uncomplicated: Secondary | ICD-10-CM

## 2017-02-02 DIAGNOSIS — F1920 Other psychoactive substance dependence, uncomplicated: Secondary | ICD-10-CM

## 2017-02-02 DIAGNOSIS — Z9851 Tubal ligation status: Secondary | ICD-10-CM

## 2017-02-02 DIAGNOSIS — O480 Post-term pregnancy: Secondary | ICD-10-CM | POA: Diagnosis present

## 2017-02-02 DIAGNOSIS — F1290 Cannabis use, unspecified, uncomplicated: Secondary | ICD-10-CM

## 2017-02-02 DIAGNOSIS — O8689 Other specified puerperal infections: Secondary | ICD-10-CM | POA: Diagnosis not present

## 2017-02-02 DIAGNOSIS — F1410 Cocaine abuse, uncomplicated: Secondary | ICD-10-CM

## 2017-02-02 DIAGNOSIS — O99324 Drug use complicating childbirth: Secondary | ICD-10-CM | POA: Diagnosis present

## 2017-02-02 DIAGNOSIS — Z8614 Personal history of Methicillin resistant Staphylococcus aureus infection: Secondary | ICD-10-CM | POA: Diagnosis not present

## 2017-02-02 DIAGNOSIS — Z8759 Personal history of other complications of pregnancy, childbirth and the puerperium: Secondary | ICD-10-CM

## 2017-02-02 DIAGNOSIS — F3289 Other specified depressive episodes: Secondary | ICD-10-CM

## 2017-02-02 DIAGNOSIS — F19929 Other psychoactive substance use, unspecified with intoxication, unspecified: Secondary | ICD-10-CM

## 2017-02-02 DIAGNOSIS — Y848 Other medical procedures as the cause of abnormal reaction of the patient, or of later complication, without mention of misadventure at the time of the procedure: Secondary | ICD-10-CM | POA: Diagnosis not present

## 2017-02-02 DIAGNOSIS — K219 Gastro-esophageal reflux disease without esophagitis: Secondary | ICD-10-CM | POA: Diagnosis present

## 2017-02-02 DIAGNOSIS — F418 Other specified anxiety disorders: Secondary | ICD-10-CM

## 2017-02-02 DIAGNOSIS — L089 Local infection of the skin and subcutaneous tissue, unspecified: Secondary | ICD-10-CM

## 2017-02-02 DIAGNOSIS — Z9889 Other specified postprocedural states: Secondary | ICD-10-CM | POA: Diagnosis not present

## 2017-02-02 DIAGNOSIS — L03312 Cellulitis of back [any part except buttock]: Secondary | ICD-10-CM | POA: Diagnosis not present

## 2017-02-02 DIAGNOSIS — F17200 Nicotine dependence, unspecified, uncomplicated: Secondary | ICD-10-CM

## 2017-02-02 DIAGNOSIS — F329 Major depressive disorder, single episode, unspecified: Secondary | ICD-10-CM | POA: Diagnosis present

## 2017-02-02 DIAGNOSIS — Z87440 Personal history of urinary (tract) infections: Secondary | ICD-10-CM

## 2017-02-02 DIAGNOSIS — Z8279 Family history of other congenital malformations, deformations and chromosomal abnormalities: Secondary | ICD-10-CM

## 2017-02-02 DIAGNOSIS — F1120 Opioid dependence, uncomplicated: Secondary | ICD-10-CM

## 2017-02-02 LAB — CBC
HEMATOCRIT: 37.2 % (ref 36.0–46.0)
Hemoglobin: 12.6 g/dL (ref 12.0–15.0)
MCH: 29.9 pg (ref 26.0–34.0)
MCHC: 33.9 g/dL (ref 30.0–36.0)
MCV: 88.4 fL (ref 78.0–100.0)
PLATELETS: 157 10*3/uL (ref 150–400)
RBC: 4.21 MIL/uL (ref 3.87–5.11)
RDW: 14 % (ref 11.5–15.5)
WBC: 11.1 10*3/uL — AB (ref 4.0–10.5)

## 2017-02-02 LAB — RAPID URINE DRUG SCREEN, HOSP PERFORMED
AMPHETAMINES: NOT DETECTED
Barbiturates: NOT DETECTED
Benzodiazepines: NOT DETECTED
Cocaine: NOT DETECTED
OPIATES: NOT DETECTED
Tetrahydrocannabinol: NOT DETECTED

## 2017-02-02 LAB — RPR: RPR Ser Ql: NONREACTIVE

## 2017-02-02 LAB — TYPE AND SCREEN
ABO/RH(D): B POS
ANTIBODY SCREEN: NEGATIVE

## 2017-02-02 MED ORDER — PHENYLEPHRINE 40 MCG/ML (10ML) SYRINGE FOR IV PUSH (FOR BLOOD PRESSURE SUPPORT)
80.0000 ug | PREFILLED_SYRINGE | INTRAVENOUS | Status: DC | PRN
  Filled 2017-02-02: qty 5

## 2017-02-02 MED ORDER — FENTANYL 2.5 MCG/ML BUPIVACAINE 1/10 % EPIDURAL INFUSION (WH - ANES)
14.0000 mL/h | INTRAMUSCULAR | Status: DC | PRN
  Administered 2017-02-02: 14 mL/h via EPIDURAL
  Filled 2017-02-02: qty 100

## 2017-02-02 MED ORDER — ONDANSETRON HCL 4 MG/2ML IJ SOLN: 4.0000 mg | Freq: Four times a day (QID) | INTRAMUSCULAR | Status: DC | PRN

## 2017-02-02 MED ORDER — DIPHENHYDRAMINE HCL 50 MG/ML IJ SOLN: 12.5000 mg | INTRAMUSCULAR | Status: DC | PRN

## 2017-02-02 MED ORDER — OXYTOCIN 40 UNITS IN LACTATED RINGERS INFUSION - SIMPLE MED: 2.5000 [IU]/h | INTRAVENOUS | Status: DC

## 2017-02-02 MED ORDER — OXYTOCIN 40 UNITS IN LACTATED RINGERS INFUSION - SIMPLE MED
2.5000 [IU]/h | INTRAVENOUS | Status: DC
  Filled 2017-02-02: qty 1000

## 2017-02-02 MED ORDER — IBUPROFEN 600 MG PO TABS
600.0000 mg | ORAL_TABLET | Freq: Four times a day (QID) | ORAL | Status: DC
  Administered 2017-02-02 – 2017-02-05 (×10): 600 mg via ORAL
  Filled 2017-02-02 (×11): qty 1

## 2017-02-02 MED ORDER — LACTATED RINGERS IV SOLN
500.0000 mL | Freq: Once | INTRAVENOUS | Status: AC
  Administered 2017-02-02: 500 mL via INTRAVENOUS

## 2017-02-02 MED ORDER — TERBUTALINE SULFATE 1 MG/ML IJ SOLN
0.2500 mg | Freq: Once | INTRAMUSCULAR | Status: DC | PRN
  Filled 2017-02-02: qty 1

## 2017-02-02 MED ORDER — EPHEDRINE 5 MG/ML INJ
10.0000 mg | INTRAVENOUS | Status: DC | PRN
  Filled 2017-02-02: qty 2

## 2017-02-02 MED ORDER — BUPRENORPHINE HCL 8 MG SL SUBL
8.0000 mg | SUBLINGUAL_TABLET | Freq: Three times a day (TID) | SUBLINGUAL | Status: DC
  Administered 2017-02-02 – 2017-02-08 (×19): 8 mg via SUBLINGUAL
  Filled 2017-02-02 (×20): qty 1

## 2017-02-02 MED ORDER — SOD CITRATE-CITRIC ACID 500-334 MG/5ML PO SOLN: 30.0000 mL | ORAL | Status: DC | PRN

## 2017-02-02 MED ORDER — SIMETHICONE 80 MG PO CHEW: 80.0000 mg | CHEWABLE_TABLET | ORAL | Status: DC | PRN

## 2017-02-02 MED ORDER — COCONUT OIL OIL: 1.0000 "application " | TOPICAL_OIL | Status: DC | PRN

## 2017-02-02 MED ORDER — LACTATED RINGERS IV SOLN
500.0000 mL | INTRAVENOUS | Status: DC | PRN
  Administered 2017-02-02: 1000 mL via INTRAVENOUS

## 2017-02-02 MED ORDER — MISOPROSTOL 25 MCG QUARTER TABLET
25.0000 ug | ORAL_TABLET | ORAL | Status: DC | PRN
  Filled 2017-02-02: qty 1

## 2017-02-02 MED ORDER — PHENYLEPHRINE 40 MCG/ML (10ML) SYRINGE FOR IV PUSH (FOR BLOOD PRESSURE SUPPORT)
80.0000 ug | PREFILLED_SYRINGE | INTRAVENOUS | Status: DC | PRN
  Filled 2017-02-02: qty 5
  Filled 2017-02-02: qty 10

## 2017-02-02 MED ORDER — BUPRENORPHINE HCL 8 MG SL SUBL: 8.0000 mg | SUBLINGUAL_TABLET | Freq: Two times a day (BID) | SUBLINGUAL | Status: DC

## 2017-02-02 MED ORDER — ZOLPIDEM TARTRATE 5 MG PO TABS: 5.0000 mg | ORAL_TABLET | Freq: Every evening | ORAL | Status: DC | PRN

## 2017-02-02 MED ORDER — LACTATED RINGERS IV SOLN
INTRAVENOUS | Status: DC
  Administered 2017-02-02: 08:00:00 via INTRAVENOUS

## 2017-02-02 MED ORDER — OXYTOCIN 40 UNITS IN LACTATED RINGERS INFUSION - SIMPLE MED
1.0000 m[IU]/min | INTRAVENOUS | Status: DC
  Administered 2017-02-02: 2 m[IU]/min via INTRAVENOUS

## 2017-02-02 MED ORDER — OXYTOCIN BOLUS FROM INFUSION
500.0000 mL | Freq: Once | INTRAVENOUS | Status: AC
Start: 2017-02-02 — End: 2017-02-02
  Administered 2017-02-02: 500 mL via INTRAVENOUS

## 2017-02-02 MED ORDER — LACTATED RINGERS IV SOLN
INTRAVENOUS | Status: DC
  Administered 2017-02-02: 11:00:00 via INTRAVENOUS

## 2017-02-02 MED ORDER — OXYCODONE-ACETAMINOPHEN 5-325 MG PO TABS: 2.0000 | ORAL_TABLET | ORAL | Status: DC | PRN

## 2017-02-02 MED ORDER — LACTATED RINGERS IV SOLN: 500.0000 mL | INTRAVENOUS | Status: DC | PRN

## 2017-02-02 MED ORDER — ONDANSETRON HCL 4 MG PO TABS: 4.0000 mg | ORAL_TABLET | ORAL | Status: DC | PRN

## 2017-02-02 MED ORDER — WITCH HAZEL-GLYCERIN EX PADS: 1.0000 "application " | MEDICATED_PAD | CUTANEOUS | Status: DC | PRN

## 2017-02-02 MED ORDER — DIPHENHYDRAMINE HCL 25 MG PO CAPS: 25.0000 mg | ORAL_CAPSULE | Freq: Four times a day (QID) | ORAL | Status: DC | PRN

## 2017-02-02 MED ORDER — SOD CITRATE-CITRIC ACID 500-334 MG/5ML PO SOLN
30.0000 mL | ORAL | Status: DC | PRN
  Administered 2017-02-02: 30 mL via ORAL
  Filled 2017-02-02: qty 15

## 2017-02-02 MED ORDER — PRENATAL MULTIVITAMIN CH
1.0000 | ORAL_TABLET | Freq: Every day | ORAL | Status: DC
  Administered 2017-02-04 – 2017-02-07 (×3): 1 via ORAL
  Filled 2017-02-02 (×3): qty 1

## 2017-02-02 MED ORDER — FENTANYL CITRATE (PF) 100 MCG/2ML IJ SOLN: 100.0000 ug | INTRAMUSCULAR | Status: DC | PRN

## 2017-02-02 MED ORDER — OXYTOCIN BOLUS FROM INFUSION: 500.0000 mL | Freq: Once | INTRAVENOUS | Status: DC

## 2017-02-02 MED ORDER — OXYCODONE-ACETAMINOPHEN 5-325 MG PO TABS: 1.0000 | ORAL_TABLET | ORAL | Status: DC | PRN

## 2017-02-02 MED ORDER — BUPRENORPHINE HCL 8 MG SL SUBL: 8.0000 mg | SUBLINGUAL_TABLET | Freq: Three times a day (TID) | SUBLINGUAL | Status: DC

## 2017-02-02 MED ORDER — BENZOCAINE-MENTHOL 20-0.5 % EX AERO: 1.0000 "application " | INHALATION_SPRAY | CUTANEOUS | Status: DC | PRN

## 2017-02-02 MED ORDER — ACETAMINOPHEN 325 MG PO TABS: 650.0000 mg | ORAL_TABLET | ORAL | Status: DC | PRN

## 2017-02-02 MED ORDER — LIDOCAINE HCL (PF) 1 % IJ SOLN
INTRAMUSCULAR | Status: DC | PRN
  Administered 2017-02-02 (×2): 7 mL via EPIDURAL

## 2017-02-02 MED ORDER — SENNOSIDES-DOCUSATE SODIUM 8.6-50 MG PO TABS
2.0000 | ORAL_TABLET | ORAL | Status: DC
  Administered 2017-02-02 – 2017-02-08 (×4): 2 via ORAL
  Filled 2017-02-02 (×4): qty 2

## 2017-02-02 MED ORDER — LIDOCAINE HCL (PF) 1 % IJ SOLN
30.0000 mL | INTRAMUSCULAR | Status: DC | PRN
  Filled 2017-02-02: qty 30

## 2017-02-02 MED ORDER — DIBUCAINE 1 % RE OINT: 1.0000 "application " | TOPICAL_OINTMENT | RECTAL | Status: DC | PRN

## 2017-02-02 MED ORDER — ONDANSETRON HCL 4 MG/2ML IJ SOLN: 4.0000 mg | INTRAMUSCULAR | Status: DC | PRN

## 2017-02-02 MED ORDER — LIDOCAINE HCL (PF) 1 % IJ SOLN: 30.0000 mL | INTRAMUSCULAR | Status: DC | PRN

## 2017-02-02 MED ORDER — FLEET ENEMA 7-19 GM/118ML RE ENEM: 1.0000 | ENEMA | RECTAL | Status: DC | PRN

## 2017-02-02 MED ORDER — ACETAMINOPHEN 325 MG PO TABS
650.0000 mg | ORAL_TABLET | ORAL | Status: DC | PRN
  Administered 2017-02-02 – 2017-02-08 (×15): 650 mg via ORAL
  Filled 2017-02-02 (×15): qty 2

## 2017-02-02 MED ORDER — BUPROPION HCL 100 MG PO TABS
100.0000 mg | ORAL_TABLET | Freq: Two times a day (BID) | ORAL | Status: DC
  Administered 2017-02-02 – 2017-02-08 (×11): 100 mg via ORAL
  Filled 2017-02-02 (×16): qty 1

## 2017-02-02 MED ORDER — TETANUS-DIPHTH-ACELL PERTUSSIS 5-2.5-18.5 LF-MCG/0.5 IM SUSP: 0.5000 mL | Freq: Once | INTRAMUSCULAR | Status: DC

## 2017-02-02 NOTE — Anesthesia Procedure Notes (Signed)
Procedures

## 2017-02-02 NOTE — Progress Notes (Signed)
Penny KennedyHeather C Hale is a 31 y.o. [id number] at 2947w6d by admitted for active labor  Subjective: Doing well, no complaints, received epidural.    Objective: BP (!) 117/93   Pulse 89   Temp 97.8 F (36.6 C) (Oral)   Resp 18   Ht 5\' 5"  (1.651 m)   Wt 82.1 kg (181 lb)   LMP  (LMP Unknown)   SpO2 96%   BMI 30.12 kg/m  No intake/output data recorded. No intake/output data recorded.  FHT:  FHR: 140 bpm, variability: moderate,  accelerations:  Present,  decelerations:  Absent UC:   regular, every 4-6 minutes SVE:   Dilation: 6.5 Effacement (%): 80 Station: -1 Exam by:: stone rnc  Labs: Lab Results  Component Value Date   WBC 11.1 (H) 02/02/2017   HGB 12.6 02/02/2017   HCT 37.2 02/02/2017   MCV 88.4 02/02/2017   PLT 157 02/02/2017    Assessment / Plan: Spontaneous labor, progressing normally  Labor: Progressing normally plan to AROM, Fetal Wellbeing:  Category I Pain Control:  Epidural I/D:  n/a Anticipated MOD:  NSVD  Penny Hale 02/02/2017, 1:01 PM

## 2017-02-02 NOTE — Anesthesia Preprocedure Evaluation (Signed)
Anesthesia Evaluation  Patient identified by MRN, date of birth, ID band Patient awake    Reviewed: Allergy & Precautions, H&P , NPO status , Patient's Chart, lab work & pertinent test results  Airway Mallampati: I  TM Distance: >3 FB Neck ROM: full    Dental no notable dental hx.    Pulmonary neg pulmonary ROS, Current Smoker,    Pulmonary exam normal breath sounds clear to auscultation       Cardiovascular negative cardio ROS Normal cardiovascular exam Rhythm:regular Rate:Normal     Neuro/Psych PSYCHIATRIC DISORDERS negative neurological ROS     GI/Hepatic negative GI ROS, Neg liver ROS, GERD  Medicated and Controlled,(+)     substance abuse  IV drug use,   Endo/Other  negative endocrine ROS  Renal/GU negative Renal ROS     Musculoskeletal negative musculoskeletal ROS (+)   Abdominal Normal abdominal exam  (+)   Peds  Hematology negative hematology ROS (+)   Anesthesia Other Findings   Reproductive/Obstetrics (+) Pregnancy                             Anesthesia Physical  Anesthesia Plan  ASA: II  Anesthesia Plan: Epidural   Post-op Pain Management:    Induction:   PONV Risk Score and Plan:   Airway Management Planned:   Additional Equipment:   Intra-op Plan:   Post-operative Plan:   Informed Consent: I have reviewed the patients History and Physical, chart, labs and discussed the procedure including the risks, benefits and alternatives for the proposed anesthesia with the patient or authorized representative who has indicated his/her understanding and acceptance.     Plan Discussed with:   Anesthesia Plan Comments:         Anesthesia Quick Evaluation

## 2017-02-02 NOTE — Anesthesia Pain Management Evaluation Note (Signed)
  CRNA Pain Management Visit Note  Patient: Penny KennedyHeather C Kurka, 31 y.o., female  "Hello I am a member of the anesthesia team at [facility]. We have an anesthesia team available at all times to provide care throughout the hospital, including epidural management and anesthesia for C-section. I don't know your plan for the delivery whether it a natural birth, water birth, IV sedation, nitrous supplementation, doula or epidural, but we want to meet your pain goals."   1.Was your pain managed to your expectations on prior hospitalizations?   Yes   2.What is your expectation for pain management during this hospitalization?     Epidural  3.How can we help you reach that goal?   Record the patient's initial score and the patient's pain goal.   Pain: 0  Pain Goal: 3 [facility] you to be able to say your pain was always managed very well.  Corian Handley Hristova 02/02/2017

## 2017-02-02 NOTE — H&P (Signed)
Obstetric History and Physical  Penny KennedyHeather C Hale is a 31 y.o. [id number] with IUP at 5177w6d presenting for SOL. Patient states she has been having  regular contractions, none vaginal bleeding, intact membranes, with active fetal movement.    Prenatal Course Source of Care: [facility]  with onset of care at 25 weeks Dating: By 23wk US --->  Estimated Date of Delivery: 01/27/17 Pregnancy complicated by polysubstance use  Pregnancy complications or risks: Patient Active Problem List   Diagnosis Date Noted  . Normal labor 02/02/2017  . Post term pregnancy at [redacted] weeks gestation 02/02/2017  . Supervision of high risk pregnancy d/t cocaine use, antepartum 10/16/2016  . History of pyelonephritis during pregnancy 10/16/2016  . Family history of birth defect 10/16/2016  . Smoker 10/16/2016  . Depression with anxiety 10/16/2016  . 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 Cocaine abuse 02/12/2016  . Opioid use disorder, moderate, dependence (HCC) 02/12/2016  . Polysubstance dependence including opioid type drug without complication, continuous use (HCC) 02/11/2016  . GERD (gastroesophageal reflux disease) 12/20/2013  . Benzodiazepine abuse 10/03/2013  . Marijuana use 10/03/2013   Baby up for adoption She desires bilateral tubal ligation for postpartum contraception.   Sono:   @[redacted]w[redacted]d , CWD, normal anatomy, cephalic presentation, anterior placenta, 63% EFW  Prenatal labs and studies: ABO, Rh: B/Positive/-- (04/12 1210) Antibody: Negative (04/12 1210) Rubella: 1.48 (04/12 1210) RPR: Non Reactive (04/12 1210)  HBsAg: Negative (04/12 1210)  HIV:   Non-reactive  AVW:UJWJXBJYGBS:Negative (07/13 0000) 1 hr Glucola  Never received, random CBG 113 Genetic screening too late Anatomy US normal  Prenatal Transfer Tool  Maternal Diabetes: No Genetic Screening: Declined Maternal Ultrasounds/Referrals: Normal Fetal Ultrasounds or other Referrals:   None Maternal Substance Abuse:  Yes:  Type: Cocaine, Other: Opiods, Subutex Significant Maternal Medications:  Meds include: Other: Subutex Significant Maternal Lab Results: None  Past Medical History:  Diagnosis Date  . ADHD (attention deficit hyperactivity disorder)    self-cutting  . Bipolar 1 disorder (HCC)   . Manic-depressive (HCC)   . Polysubstance abuse    cocaine, opiates, benzos, marijuana  . Seizures (HCC)    drug related  . Trichomonal vaginitis     Past Surgical History:  Procedure Laterality Date  . DILATION AND CURETTAGE OF UTERUS      OB History  Gravida Para Term Preterm AB Living  8 5 5   2 5   SAB TAB Ectopic Multiple Live Births  2       5    # Outcome Date GA Lbr Len/2nd Weight Sex Delivery Anes PTL Lv  8 Current           7 Term 01/10/14 1324w2d 07:35 / 00:18 5 lb 4 oz (2.381 kg) M Vag-Spont EPI N LIV     Birth Comments: within normal limts  6 SAB 08/04/11 36681w0d            Birth Comments: no arms and legs  5 Term 03/08/11 1964w0d  6 lb 6 oz (2.892 kg) M Vag-Spont None N LIV  4 Term 03/17/09 3464w0d  6 lb 5 oz (2.863 kg) F Vag-Spont EPI N LIV  3 Term 12/11/05 49881w0d  6 lb 3 oz (2.807 kg) F Vag-Spont EPI N LIV  2 SAB 11/02/03 1081w0d         1 Term 04/01/01 3732w0d  8 lb 5 oz (3.771 kg) F Vag-Spont EPI N LIV      Social History  Social History  . Marital status: Single    Spouse name: N/A  . Number of children: N/A  . Years of education: N/A   Social History Main Topics  . Smoking status: Current Every Day Smoker    Packs/day: 0.25    Years: 12.00    Types: Cigarettes  . Smokeless tobacco: Never Used  . Alcohol use Yes     Comment: alcohol use in early pregnancy  . Drug use: Yes    Types: Cocaine, Marijuana, "Crack" cocaine, Oxycodone  . Sexual activity: Not Currently    Birth control/ protection: None   Other Topics Concern  . None   Social History Narrative  . None    Family History  Problem Relation Age of Onset  . COPD Mother   .  Emphysema Mother   . Mental illness Sister   . Depression Sister   . Mental illness Brother   . Depression Brother   . ADD / ADHD Son     Prescriptions Prior to Admission  Medication Sig Dispense Refill Last Dose  . acetaminophen (TYLENOL) 325 MG tablet Take 650 mg by mouth every 6 (six) hours as needed for mild pain or headache.   02/01/2017 at Unknown time  . buprenorphine (SUBUTEX) 8 MG SUBL SL tablet Place 8 mg under the tongue every 8 (eight) hours.    02/02/2017 at Unknown time  . buPROPion (WELLBUTRIN) 100 MG tablet Take 1 tablet (100 mg total) by mouth 2 (two) times daily. 60 tablet 2 02/01/2017 at Unknown time  . hydrocortisone cream 1 % Apply 1 application topically 4 (four) times daily as needed for itching.   Past Week at Unknown time  . IRON PO Take by mouth daily.   02/01/2017 at Unknown time  . pantoprazole (PROTONIX) 20 MG tablet Take 1 tablet (20 mg total) by mouth daily. 30 tablet 0 02/01/2017 at Unknown time  . Prenatal Vit-Fe Fumarate-FA (PRENATAL VITAMIN) 27-0.8 MG TABS Take 1 tablet by mouth daily. 30 tablet 11 02/01/2017 at Unknown time  . nicotine (NICODERM CQ - DOSED IN MG/24 HOURS) 21 mg/24hr patch Place 1 patch (21 mg total) onto the skin daily. (Patient not taking: Reported on 02/02/2017) 28 patch 1 Not Taking at Unknown time    No Known Allergies  Review of Systems: Negative except for what is mentioned in HPI.  Physical Exam: BP 125/79 (BP Location: Left Arm)   Pulse 83   Resp 20   LMP  (LMP Unknown)   SpO2 98%  CONSTITUTIONAL: Well-developed, well-nourished female in no acute distress.  HENT:  Normocephalic, atraumatic, External right and left ear normal. Oropharynx is clear and moist EYES: Conjunctivae and EOM are normal. Pupils are equal, round, and reactive to light. No scleral icterus.  NECK: Normal range of motion, supple, no masses SKIN: Skin is warm and dry. No rash noted. Not diaphoretic. No erythema. No pallor. NEUROLOGIC: Alert and oriented to  person, place, and time. Normal reflexes, muscle tone coordination. No cranial nerve deficit noted. PSYCHIATRIC: Normal mood and affect. Normal behavior. Normal judgment and thought content. CARDIOVASCULAR: Normal heart rate noted, regular rhythm RESPIRATORY: Effort and breath sounds normal, no problems with respiration noted ABDOMEN: Soft, nontender, nondistended, gravid. MUSCULOSKELETAL: Normal range of motion. No edema and no tenderness. 2+ distal pulses.  Dilation: 5.5 Effacement (%): 80 Cervical Position: Middle Station: -2 Presentation: Vertex Exam by:: Janeth Rase RNC  Presentation: cephalic FHT:  Baseline rate 135 bpm   Variability moderate  Accelerations present  Decelerations early Contractions: Every 4-5 mins   Pertinent Labs/Studies:   Results for orders placed or performed during the hospital encounter of 02/02/17 (from the past 24 hour(s))  CBC     Status: Abnormal   Collection Time: 02/02/17  8:15 AM  Result Value Ref Range   WBC 11.1 (H) 4.0 - 10.5 K/uL   RBC 4.21 3.87 - 5.11 MIL/uL   Hemoglobin 12.6 12.0 - 15.0 g/dL   HCT 40.937.2 81.136.0 - 91.446.0 %   MCV 88.4 78.0 - 100.0 fL   MCH 29.9 26.0 - 34.0 pg   MCHC 33.9 30.0 - 36.0 g/dL   RDW 78.214.0 95.611.5 - 21.315.5 %   Platelets 157 150 - 400 K/uL    Assessment : Penny Hale is a 31 y.o. [id number] at 8537w6d being admitted for labor.  Plan: Labor: Expectant management. Analgesia as needed; plan for epidural. FWB: Reassuring fetal heart tracing.  GBS negative Delivery plan: Hopeful for vaginal delivery   Caryl AdaJazma Phelps, DO OB Fellow Faculty Practice, [facility] 02/02/2017, 8:47 AM

## 2017-02-02 NOTE — Anesthesia Procedure Notes (Signed)
Epidural Patient location during procedure: OB Start time: 02/02/2017 9:17 AM End time: 02/02/2017 9:21 AM  Staffing Anesthesiologist: Leilani AbleHATCHETT, Clois Montavon Performed: anesthesiologist   Preanesthetic Checklist Completed: patient identified, site marked, surgical consent, pre-op evaluation, timeout performed, IV checked, risks and benefits discussed and monitors and equipment checked  Epidural Patient position: sitting Prep: site prepped and draped and DuraPrep Patient monitoring: continuous pulse ox and blood pressure Approach: midline Location: L3-L4 Injection technique: LOR air  Needle:  Needle type: Tuohy  Needle gauge: 17 G Needle length: 9 cm and 9 Needle insertion depth: 5 cm cm Catheter type: closed end flexible Catheter size: 19 Gauge Catheter at skin depth: 10 cm Test dose: negative and Other  Assessment Sensory level: T9 Events: blood not aspirated, injection not painful, no injection resistance, negative IV test and no paresthesia

## 2017-02-02 NOTE — Anesthesia Procedure Notes (Deleted)
Epidural Patient location during procedure: OB Start time: 02/02/2017 9:15 AM End time: 02/02/2017 9:18 AM  Staffing Anesthesiologist: Leilani AbleHATCHETT, Destanie Tibbetts Performed: anesthesiologist   Preanesthetic Checklist Completed: patient identified, surgical consent, pre-op evaluation, timeout performed, IV checked, risks and benefits discussed and monitors and equipment checked  Epidural Patient position: sitting Prep: site prepped and draped and DuraPrep Patient monitoring: continuous pulse ox and blood pressure Approach: midline Location: L3-L4 Injection technique: LOR air  Needle:  Needle type: Tuohy  Needle gauge: 17 G Needle length: 9 cm and 9 Needle insertion depth: 5 cm cm Catheter type: closed end flexible Catheter size: 19 Gauge Catheter at skin depth: 10 cm Test dose: negative and Other  Assessment Sensory level: T9 Events: blood not aspirated, injection not painful, no injection resistance, negative IV test and no paresthesia  Additional Notes Reason for block:procedure for pain

## 2017-02-02 NOTE — Progress Notes (Signed)
CSW acknowledges consult for current substance abuse.  CSW is familiar with patient from previous delivery as well as being contacted by adoption agency during this pregnancy, as patient was initially making an adoption plan.  CSW understands from agency that patient has changed her mind and wants to make plans to parent.  CSW will complete psychosocial assessment once baby is delivered and patient in on the Mother-Baby unit.  CSW spoke with patient's RN to request UDS be completed.

## 2017-02-03 ENCOUNTER — Inpatient Hospital Stay (HOSPITAL_COMMUNITY): Admission: RE | Admit: 2017-02-03 | Payer: Medicaid Other | Source: Ambulatory Visit

## 2017-02-03 ENCOUNTER — Inpatient Hospital Stay (HOSPITAL_COMMUNITY): Payer: Medicaid Other | Admitting: Anesthesiology

## 2017-02-03 ENCOUNTER — Encounter (HOSPITAL_COMMUNITY)
Admission: AD | Disposition: A | Discharge status: Home / Self Care | Payer: Self-pay | Source: Ambulatory Visit | Attending: Obstetrics & Gynecology

## 2017-02-03 ENCOUNTER — Encounter (HOSPITAL_COMMUNITY): Payer: Self-pay | Admitting: Family Medicine

## 2017-02-03 HISTORY — PX: TUBAL LIGATION: SHX77

## 2017-02-03 SURGERY — LIGATION, FALLOPIAN TUBE, POSTPARTUM
Anesthesia: Epidural

## 2017-02-03 MED ORDER — BUPIVACAINE HCL (PF) 0.25 % IJ SOLN
INTRAMUSCULAR | Status: DC | PRN
  Administered 2017-02-03: 30 mL

## 2017-02-03 MED ORDER — MIDAZOLAM HCL 2 MG/2ML IJ SOLN
INTRAMUSCULAR | Status: AC
  Filled 2017-02-03: qty 2

## 2017-02-03 MED ORDER — LIDOCAINE-EPINEPHRINE (PF) 2 %-1:200000 IJ SOLN
INTRAMUSCULAR | Status: AC
  Filled 2017-02-03: qty 20

## 2017-02-03 MED ORDER — FENTANYL CITRATE (PF) 100 MCG/2ML IJ SOLN
INTRAMUSCULAR | Status: DC | PRN
  Administered 2017-02-03: 50 ug via INTRAVENOUS

## 2017-02-03 MED ORDER — LACTATED RINGERS IV SOLN: INTRAVENOUS | Status: DC

## 2017-02-03 MED ORDER — KETOROLAC TROMETHAMINE 30 MG/ML IJ SOLN
30.0000 mg | Freq: Four times a day (QID) | INTRAMUSCULAR | Status: DC | PRN
Start: 2017-02-03 — End: 2017-02-03

## 2017-02-03 MED ORDER — LACTATED RINGERS IV SOLN
INTRAVENOUS | Status: DC | PRN
  Administered 2017-02-03: 15:00:00 via INTRAVENOUS

## 2017-02-03 MED ORDER — KETOROLAC TROMETHAMINE 30 MG/ML IJ SOLN: 30.0000 mg | Freq: Four times a day (QID) | INTRAMUSCULAR | Status: DC | PRN

## 2017-02-03 MED ORDER — BUPIVACAINE HCL (PF) 0.25 % IJ SOLN
INTRAMUSCULAR | Status: AC
  Filled 2017-02-03: qty 30

## 2017-02-03 MED ORDER — SODIUM CHLORIDE 0.9 % IR SOLN
Status: DC | PRN
  Administered 2017-02-03: 1

## 2017-02-03 MED ORDER — ONDANSETRON HCL 4 MG/2ML IJ SOLN
INTRAMUSCULAR | Status: DC | PRN
  Administered 2017-02-03: 4 mg via INTRAVENOUS

## 2017-02-03 MED ORDER — SODIUM BICARBONATE 8.4 % IV SOLN
INTRAVENOUS | Status: AC
  Filled 2017-02-03: qty 50

## 2017-02-03 MED ORDER — SODIUM BICARBONATE 8.4 % IV SOLN
INTRAVENOUS | Status: DC | PRN
  Administered 2017-02-03: 2 mL via EPIDURAL
  Administered 2017-02-03 (×3): 5 mL via EPIDURAL

## 2017-02-03 MED ORDER — ONDANSETRON HCL 4 MG/2ML IJ SOLN
INTRAMUSCULAR | Status: AC
  Filled 2017-02-03: qty 2

## 2017-02-03 MED ORDER — FENTANYL CITRATE (PF) 100 MCG/2ML IJ SOLN
INTRAMUSCULAR | Status: AC
  Filled 2017-02-03: qty 2

## 2017-02-03 MED ORDER — MIDAZOLAM HCL 5 MG/5ML IJ SOLN
INTRAMUSCULAR | Status: DC | PRN
  Administered 2017-02-03: 2 mg via INTRAVENOUS

## 2017-02-03 MED ORDER — KETOROLAC TROMETHAMINE 30 MG/ML IJ SOLN
30.0000 mg | Freq: Four times a day (QID) | INTRAMUSCULAR | Status: DC | PRN
  Administered 2017-02-04 (×2): 30 mg via INTRAVENOUS
  Filled 2017-02-03 (×2): qty 1

## 2017-02-03 MED ORDER — HYDROMORPHONE HCL 1 MG/ML IJ SOLN: 0.2500 mg | INTRAMUSCULAR | Status: DC | PRN

## 2017-02-03 MED ORDER — NICOTINE 21 MG/24HR TD PT24
21.0000 mg | MEDICATED_PATCH | Freq: Every day | TRANSDERMAL | Status: DC
  Filled 2017-02-03 (×7): qty 1

## 2017-02-03 SURGICAL SUPPLY — 22 items
BLADE SURG 11 STRL SS (BLADE) ×3 IMPLANT
CLIP FILSHIE TUBAL LIGA STRL (Clip) ×3 IMPLANT
CLOTH BEACON ORANGE TIMEOUT ST (SAFETY) ×3 IMPLANT
DRSG OPSITE POSTOP 3X4 (GAUZE/BANDAGES/DRESSINGS) ×3 IMPLANT
DURAPREP 26ML APPLICATOR (WOUND CARE) ×3 IMPLANT
GLOVE BIOGEL PI IND STRL 7.0 (GLOVE) ×1 IMPLANT
GLOVE BIOGEL PI IND STRL 7.5 (GLOVE) ×1 IMPLANT
GLOVE BIOGEL PI INDICATOR 7.0 (GLOVE) ×2
GLOVE BIOGEL PI INDICATOR 7.5 (GLOVE) ×2
GLOVE ECLIPSE 7.5 STRL STRAW (GLOVE) ×3 IMPLANT
GOWN STRL REUS W/TWL LRG LVL3 (GOWN DISPOSABLE) ×6 IMPLANT
NEEDLE HYPO 22GX1.5 SAFETY (NEEDLE) ×3 IMPLANT
NS IRRIG 1000ML POUR BTL (IV SOLUTION) ×3 IMPLANT
PACK ABDOMINAL MINOR (CUSTOM PROCEDURE TRAY) ×3 IMPLANT
PROTECTOR NERVE ULNAR (MISCELLANEOUS) ×3 IMPLANT
SPONGE LAP 4X18 X RAY DECT (DISPOSABLE) IMPLANT
SUT VICRYL 0 UR6 27IN ABS (SUTURE) ×3 IMPLANT
SUT VICRYL 4-0 PS2 18IN ABS (SUTURE) ×3 IMPLANT
SYR CONTROL 10ML LL (SYRINGE) ×3 IMPLANT
TOWEL OR 17X24 6PK STRL BLUE (TOWEL DISPOSABLE) ×6 IMPLANT
TRAY FOLEY CATH SILVER 14FR (SET/KITS/TRAYS/PACK) ×3 IMPLANT
WATER STERILE IRR 1000ML POUR (IV SOLUTION) ×3 IMPLANT
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
## 2017-02-03 NOTE — Progress Notes (Addendum)
MD called to assess PTs epidural site due to intolerance to high degree of pain.Pt crying. MD and RN at bedside with pt discussing pain mgmt thru the night. Will evaluate pain in the am. Pt verbalized concern that nurses would misinterpret pts inability to care for her baby while experiencing a great deal of pain. Nurses reassured the pt. Pt bonding with her baby.

## 2017-02-03 NOTE — Discharge Instructions (Signed)

## 2017-02-03 NOTE — Progress Notes (Signed)
Patient ID: Leroy KennedyHeather C Kees, female   DOB: January 09, 1986, 31 y.o.   MRN: [id number]  Risks of procedure discussed with patient including but not limited to: risk of regret, permanence of method, bleeding, infection, injury to surrounding organs and need for additional procedures.  Failure risk of 1 -2 % with increased risk of ectopic gestation if pregnancy occurs was also discussed with patient.    Levie HeritageStinson, Danaija Eskridge J, DO 02/03/2017 9:25 AM

## 2017-02-03 NOTE — Transfer of Care (Signed)
Immediate Anesthesia Transfer of Care Note  Patient: Penny KennedyHeather C Hale  Procedure(s) Performed: Procedure(s): POST PARTUM TUBAL LIGATION (N/A)  Patient Location: PACU  Anesthesia Type:Epidural  Level of Consciousness: awake, alert  and oriented  Airway & Oxygen Therapy: Patient Spontanous Breathing and Patient connected to nasal cannula oxygen  Post-op Assessment: Report given to RN and Post -op Vital signs reviewed and stable  Post vital signs: Reviewed and stable  Last Vitals:  Vitals:   02/03/17 0723 02/03/17 1427  BP: 104/72 (!) 104/52  Pulse: 79 72  Resp: 18 18  Temp:      Last Pain:  Vitals:   02/03/17 1427  TempSrc: Oral  PainSc:          Complications: No apparent anesthesia complications

## 2017-02-03 NOTE — Progress Notes (Signed)
Post Partum Day #1 Subjective: no complaints, up ad lib, voiding, tolerating PO and says that she is supposed to have a BTL today, is NPO, does report back pain  Objective: Blood pressure 113/63, pulse 75, temperature (!) 97.5 F (36.4 C), temperature source Oral, resp. rate 18, height 5\' 5"  (1.651 m), weight 82.1 kg (181 lb), SpO2 97 %, unknown if currently breastfeeding.  Physical Exam:  General: alert Lochia: appropriate Uterine Fundus: firm DVT Evaluation: No evidence of DVT seen on physical exam.   Recent Labs  02/02/17 0815  HGB 12.6  HCT 37.2    Assessment/Plan: Plan for discharge tomorrow  BTL today   LOS: 1 day   Penny BossierMyra C Tian Mcmurtrey 02/03/2017, 6:34 AM

## 2017-02-03 NOTE — Anesthesia Preprocedure Evaluation (Signed)
Anesthesia Evaluation  Patient identified by MRN, date of birth, ID band Patient awake    Reviewed: Allergy & Precautions, H&P , NPO status , Patient's Chart, lab work & pertinent test results  Airway Mallampati: I  TM Distance: >3 FB Neck ROM: full    Dental no notable dental hx.    Pulmonary neg pulmonary ROS, Current Smoker,    Pulmonary exam normal breath sounds clear to auscultation       Cardiovascular negative cardio ROS Normal cardiovascular exam Rhythm:regular Rate:Normal     Neuro/Psych PSYCHIATRIC DISORDERS negative neurological ROS     GI/Hepatic negative GI ROS, Neg liver ROS, GERD  Medicated and Controlled,(+)     substance abuse  IV drug use,   Endo/Other  negative endocrine ROS  Renal/GU negative Renal ROS     Musculoskeletal negative musculoskeletal ROS (+)   Abdominal Normal abdominal exam  (+)   Peds  Hematology negative hematology ROS (+)   Anesthesia Other Findings   Reproductive/Obstetrics (+) Pregnancy                             Anesthesia Physical  Anesthesia Plan  ASA: II  Anesthesia Plan: Epidural   Post-op Pain Management:    Induction:   PONV Risk Score and Plan:   Airway Management Planned:   Additional Equipment:   Intra-op Plan:   Post-operative Plan:   Informed Consent: I have reviewed the patients History and Physical, chart, labs and discussed the procedure including the risks, benefits and alternatives for the proposed anesthesia with the patient or authorized representative who has indicated his/her understanding and acceptance.     Plan Discussed with:   Anesthesia Plan Comments:         Anesthesia Quick Evaluation  

## 2017-02-03 NOTE — Lactation Note (Signed)
This note was copied from a baby's chart. Lactation Consultation Note  P6 mom with no bf experience.  Mom states she desires to bf due to being on subutex.  Mom is able to hand express and demonstrated for LC.  Drops were collected in spoon and finger fed to infant.   Mom is very uncomfortable from epidural in place; awaiting tubal.  She is unable to lean back and relax during feed but pillows and blankets were used to aide in comfort.  Mom attempted with LC multiple times to latch infant but infant would suck her tongue.  Suck training was attempted..then LC tried relatching but infant would not open wide even with colostrum at nipple.  LC suggested NS  In order to help with almost 24 hours old infant latching.  LC reviewed with her the importance of DEBP use while using a NS and the purpose of pumping.  Mom states she is willing to try it and will pump after each feed with pump.  Mom states again that she really wants infant to BF.    #20 NS used.  Cleaning and placement on nipple reviewed.  Infant was able to latch with NS in football hold.  Mom used compression to express colostrum into shield.  Infant had wide gape and rhythmic sucking motion noted , no swallows heard.  Infant latched and sucked well and was on deeply for 3 minutes then fell asleep.  Mom states she needs to use the rest room and is about to leave room for OR/tubal.    LC reiterates importance of continuing to supplement infant and to call out after tubal when she BF infant in order to get assistance with NS and pumping instructions, (cleaning, storing, ect.).   Mom says she will [facility]    LC discussed with RN about NS and DEBP needing to be set up,   Mom was given OP LC information, Support groups available, and resource sheet.  [place] [facility]      Patient Name: Penny Hale Hale's Date: 02/03/2017 Reason for consult: Initial assessment   Maternal Data Formula  Feeding for Exclusion: No Has patient been taught Hand Expression?: Yes Does the patient have breastfeeding experience prior to this delivery?: No  Feeding Feeding Type: Breast Fed  LATCH Score Latch: Too sleepy or reluctant, no latch achieved, no sucking elicited.  Audible Swallowing: None  Type of Nipple: Everted at rest and after stimulation (taunt, not easily compressible tissue but everted/infant sucking her tongue will not open wide)  Comfort (Breast/Nipple): Soft / non-tender  Hold (Positioning): Assistance needed to correctly position infant at breast and maintain latch.  LATCH Score: 5  Interventions Interventions: Breast feeding basics reviewed;Assisted with latch;Skin to skin;Breast massage;Hand express;Breast compression;Adjust position;Expressed milk;Support pillows;Position options  Lactation Tools Discussed/Used WIC Program: Yes   Consult Status Consult Status: Follow-up Date: 02/04/17 Follow-up type: In-patient    Maryruth HancockKelly Suzanne [facility] 02/03/2017, 3:12 PM

## 2017-02-03 NOTE — Anesthesia Postprocedure Evaluation (Signed)
Anesthesia Post Note  Patient: Penny KennedyHeather C Hale  Procedure(s) Performed: * No procedures listed *     Patient location during evaluation: Mother Baby Anesthesia Type: Epidural Level of consciousness: awake and alert and oriented Pain management: pain level controlled Vital Signs Assessment: post-procedure vital signs reviewed and stable Respiratory status: spontaneous breathing and nonlabored ventilation Cardiovascular status: stable Postop Assessment: no headache, no signs of nausea or vomiting, no backache, adequate PO intake, epidural receding and patient able to bend at knees Anesthetic complications: no    Last Vitals:  Vitals:   02/03/17 0530 02/03/17 0723  BP: 113/63 104/72  Pulse: 75 79  Resp: 18 18  Temp: (!) 36.4 C     Last Pain:  Vitals:   02/03/17 0723  TempSrc:   PainSc: 5    Pain Goal:                 Laban EmperorMalinova,Coree Brame Hristova

## 2017-02-03 NOTE — Op Note (Signed)
Penny KennedyHeather C Hale 02/02/2017 - 02/03/2017  PREOPERATIVE DIAGNOSIS:  Undesired fertility  POSTOPERATIVE DIAGNOSIS:  Undesired fertility  PROCEDURE:  Postpartum Bilateral Tubal Sterilization using Filshie Clips   SURGEON:  Dr Candelaria CelesteJacob Sharrieff Spratlin  ANESTHESIA:  Epidural  COMPLICATIONS:  None immediate.  ESTIMATED BLOOD LOSS:  Less than 20cc.  FLUIDS: 300 mL LR.  URINE OUTPUT:  50 mL of clear urine.  INDICATIONS: 31 y.o. yo [id number]  with undesired fertility,status post vaginal delivery, desires permanent sterilization. Risks and benefits of procedure discussed with patient including permanence of method, bleeding, infection, injury to surrounding organs and need for additional procedures. Risk failure of 0.5-1% with increased risk of ectopic gestation if pregnancy occurs was also discussed with patient.   FINDINGS:  Normal uterus, tubes, and ovaries.  TECHNIQUE:  The patient was taken to the operating room where her epidural anesthesia was dosed up to surgical level and found to be adequate.  She was then placed in the dorsal supine position and prepped and draped in sterile fashion.  After an adequate timeout was performed, attention was turned to the patient's abdomen where a small transverse skin incision was made in the umbilical fold. The incision was taken down to the layer of fascia using the scalpel, and fascia was incised, and extended bilaterally using Mayo scissors. The peritoneum was entered in a sharp fashion. Attention was then turned to the patient's uterus, and left fallopian tube was identified and followed out to the fimbriated end.  A Filshie clip was placed on the left fallopian tube about 2 cm from the cornual attachment, with care given to incorporate the underlying mesosalpinx.  A similar process was carried out on the rightl side allowing for bilateral tubal sterilization.  Good hemostasis was noted overall.  Local analgesia was drizzled on both operative sites.The instruments were  then removed from the patient's abdomen and the fascial incision was repaired with 0 Vicryl, and the skin was closed with a 3-0 Monocryl subcuticular stitch. The patient tolerated the procedure well.  Sponge, lap, and needle counts were correct times two.  The patient was then taken to the recovery room awake, extubated and in stable condition.   Penny HeritageStinson, Penny Cianci Hale, Penny Hale 02/03/2017 4:05 PM

## 2017-02-03 NOTE — Progress Notes (Signed)
PT tearful about BTL. Comfort provided.Plans for baby made with PT and family member.

## 2017-02-03 NOTE — Anesthesia Postprocedure Evaluation (Signed)
Anesthesia Post Note  Patient: Penny KennedyHeather C Hale  Procedure(s) Performed: Procedure(s) (LRB): POST PARTUM TUBAL LIGATION (N/A)     Patient location during evaluation: PACU Anesthesia Type: Epidural Level of consciousness: awake Pain management: satisfactory to patient Vital Signs Assessment: post-procedure vital signs reviewed and stable Respiratory status: spontaneous breathing Cardiovascular status: blood pressure returned to baseline Postop Assessment: no headache and spinal receding Anesthetic complications: no    Last Vitals:  Vitals:   02/03/17 1630 02/03/17 1700  BP:  (!) 105/56  Pulse: 79 72  Resp: 15 18  Temp:  36.5 C    Last Pain:  Vitals:   02/03/17 1700  TempSrc:   PainSc: 0-No pain   Pain Goal:                 Jiles GarterJACKSON,Jacquette Canales EDWARD

## 2017-02-03 NOTE — Progress Notes (Signed)
Pt has rope ankle barcelet on c small metal charm, she reports she can't get it off.  Purlpe sock covered it

## 2017-02-04 ENCOUNTER — Encounter (HOSPITAL_COMMUNITY): Payer: Self-pay

## 2017-02-04 MED ORDER — SULFAMETHOXAZOLE-TRIMETHOPRIM 800-160 MG PO TABS
1.0000 | ORAL_TABLET | Freq: Two times a day (BID) | ORAL | Status: DC
Start: 2017-02-04 — End: 2017-02-04
  Administered 2017-02-04: 1 via ORAL
  Filled 2017-02-04 (×2): qty 1

## 2017-02-04 MED ORDER — METHOCARBAMOL 500 MG PO TABS
500.0000 mg | ORAL_TABLET | Freq: Three times a day (TID) | ORAL | Status: DC
  Administered 2017-02-04 – 2017-02-08 (×11): 500 mg via ORAL
  Filled 2017-02-04 (×18): qty 1

## 2017-02-04 MED ORDER — SULFAMETHOXAZOLE-TRIMETHOPRIM 400-80 MG PO TABS
1.0000 | ORAL_TABLET | Freq: Two times a day (BID) | ORAL | Status: DC
  Filled 2017-02-04 (×2): qty 1

## 2017-02-04 MED ORDER — SULFAMETHOXAZOLE-TRIMETHOPRIM 800-160 MG PO TABS
1.0000 | ORAL_TABLET | Freq: Two times a day (BID) | ORAL | Status: DC
  Filled 2017-02-04 (×2): qty 1

## 2017-02-04 MED ORDER — VANCOMYCIN HCL IN DEXTROSE 1-5 GM/200ML-% IV SOLN
1000.0000 mg | Freq: Once | INTRAVENOUS | Status: AC
  Administered 2017-02-04: 1000 mg via INTRAVENOUS
  Filled 2017-02-04: qty 200

## 2017-02-04 MED ORDER — HYDROCODONE-ACETAMINOPHEN 5-325 MG PO TABS
2.0000 | ORAL_TABLET | Freq: Once | ORAL | Status: AC
  Administered 2017-02-04: 2 via ORAL
  Filled 2017-02-04: qty 2

## 2017-02-04 MED ORDER — IBUPROFEN 600 MG PO TABS: 600.0000 mg | ORAL_TABLET | Freq: Four times a day (QID) | ORAL | 0 refills | Status: DC | PRN

## 2017-02-04 NOTE — Progress Notes (Signed)
Went outside and asked patient to return to her room, she has an iv antibiotic due, patient agreed.

## 2017-02-04 NOTE — Anesthesia Postprocedure Evaluation (Signed)
Anesthesia Post Note  Patient: Penny KennedyHeather C Micke  Procedure(s) Performed: Procedure(s) (LRB): POST PARTUM TUBAL LIGATION (N/A)     Anesthesia Type: Epidural Anesthetic complications: yes Anesthetic complication details: anesthesia complicationsComments: Called to see patient in room 109 status post post partum tubal ligation with epidural that functioned well.  Patient is complaining of back pain, pain  at epidural site and right arm pain.  Site of epidural shows redness and tenderness no calor or tumor.  Will speak with OB attending about keeping patient an additional day such as to start IV antibiotics. I plan to follow up this afternoon with patient. ej Jun Rightmyer jr md    Last Vitals:  Vitals:   02/04/17 0207 02/04/17 0630  BP: 90/60 112/70  Pulse: 68 66  Resp: 16 18  Temp: 36.7 C (!) 36.3 C    Last Pain:  Vitals:   02/04/17 0623  TempSrc:   PainSc: 6    Pain Goal:                 [name]

## 2017-02-04 NOTE — Progress Notes (Signed)
Consulted by Dr. Tacy Duraddono regarding possible epidural site infection, concern for MRSA given substance abuse history and we will treat skin infection empirically with MRSA coverage. . Recommended starting vancomycin now with IV dose, per pharmacy. Will start bactrim this evening. Hopeful discharge tomorrow on course of PO bactrim  Federico FlakeKimberly Niles Ronique Simerly, MD, MPH, ABFM Attending Physician Faculty Practice- [facility]
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
## 2017-02-04 NOTE — Progress Notes (Signed)
S: Pain at epidural site and low back and right leg pain  O: Redness and pain at old epidural site  A/P pt given one dose of vancomycin this am and started on PO bactrim this evening.  I have requested nursing to move bactrim dose up to 1730 and give muscle relaxant robaxin Tid for low back pain.

## 2017-02-04 NOTE — Progress Notes (Signed)
Patient given warm apple juice and prune juice mix to drink.

## 2017-02-04 NOTE — Progress Notes (Signed)
Anesthesia Postprocedure Evaluation Date of Service: 02/04/2017 9:17 AM Bethena Midgetddono, Shanavia Makela, MD  Anesthesiology    [] Hide copied text [] Hover for attribution information  Anesthesia Post Note  Patient: Penny KennedyHeather C Hale  Procedure(s) Performed: Procedure(s) (LRB): POST PARTUM TUBAL LIGATION (N/A)     Anesthesia Type: Epidural Anesthetic complications: yes Anesthetic complication details: anesthesia complicationsComments: Called to see patient in room 109 status post post partum tubal ligation with epidural that functioned well.  Patient is complaining of back pain, pain  at epidural site and right arm pain.  Site of epidural shows redness and tenderness no calor or tumor.  Will speak with OB attending about keeping patient an additional day such as to start IV antibiotics. I plan to follow up this afternoon with patient. ej Bernice Mullin jr md    Last Vitals:      Vitals:   02/04/17 0207 02/04/17 0630  BP: 90/60 112/70  Pulse: 68 66  Resp: 16 18  Temp: 36.7 C (!) 36.3 C    Last Pain:     Vitals:   02/04/17 0623  TempSrc:   PainSc: 6    Pain Goal:     Penny Hale

## 2017-02-04 NOTE — Lactation Note (Signed)
This note was copied from a baby's chart. Lactation Consultation Note  Patient Name: Girl Azalee CourseHeather Beckmann ZOXWR'UToday's Date: 02/04/2017 Reason for consult: Other (Comment) (see note)  Mother had gone out to the desk asking about safety with breastfeeding & taking Wellbutrin, Robaxin, & Vancomycin. Mother's chart reviewed. Her current med list consists of the following: -Subutex 8mg  tid (L2) -Wellbutrin 100mg  bid (L3) -Methocarbamol 500mg  tid (L3) -Vancomycin (L1) -Bactrim DS (L3) -Nicoderm patch (L3)  Additional review of Mom's chart showed relapse [facility] & May of this [facility]; ED admission for suicidal ideation in Aug of 2017 secondary to cocaine intoxication. EPIC shows intermittent positive drug screens for cocaine since Sept 2010. Mother's last birth in July of 2015 documented her reported use of crack cocaine the day before delivery.   Infant's last NAS score was a 1. Current RN, Carma LeavenMiakita, states that infant does not appear to be actively withdrawing. Secretary is currently bottle feeding formula to the infant while mother goes outside to smoke. In light of mother's long struggle with cocaine use, current low NAS score, & the small amount of Subutex that is actually transferred through breast milk (relative infant dose of subutex is .09-1.9% [per Maisie Fushomas Hale's "Medications & Mother's Milk", 2017]), I would not encourage the expression of milk in this mother. My views were shared with the current RN.   Lurline HareRichey, Joushua Dugar [facility] 02/04/2017, 9:28 PM

## 2017-02-04 NOTE — Addendum Note (Signed)
Addendum  created 02/04/17 1806 by Bethena Midgetddono, Roselie Cirigliano, MD   Sign clinical note

## 2017-02-04 NOTE — Plan of Care (Signed)
Problem: Pain Management: Goal: General experience of comfort will improve and pain level will decrease Outcome: Progressing Patient c/o of back pain during shift. Toradol IV ordered for patient and administered with Tylenol per order, providing only mild relief. Will continue to monitor.

## 2017-02-04 NOTE — Progress Notes (Signed)
Patient undecided if she wants tdap vaccine before dc.

## 2017-02-04 NOTE — Lactation Note (Signed)
This note was copied from a baby's chart. Lactation Consultation Note  Patient Name: Penny Azalee CourseHeather Yahr OZHYQ'MToday's Date: 02/04/2017   Dyad is not in the room. Lactation to f/u before d/c today.    [name] 02/04/2017, 10:22 AM

## 2017-02-04 NOTE — Progress Notes (Signed)
At 0630, Patient stated she took her own Subutex from home around 0230 because of her pain. Nurse had already  administered her ordered subutex at 0204 [facility].

## 2017-02-04 NOTE — Lactation Note (Signed)
This note was copied from a baby's chart. Lactation Consultation Note  Patient Name: Penny Hale ZOXWR'UToday's Date: 02/04/2017 Reason for consult: Follow-up assessment  Baby at 45 hr of life. Mom stated she has been in "so much pain I can not pump". She reports having "cellulitis in my epidural site". She stated that "no one will give me pain medication". Mom declined latch help. She would like to pump to feed only. Reviewed paced bottle feeding. Encouraged her to pump f/u by manual expression 8+/24hr. Mom declined help with pumping at this visit, "because I just can't move right now". Discussed baby behavior, feeding frequency, baby belly size, voids, wt loss, breast changes, and nipple care. Mom is aware of lactation services and support group. She will call as needed.       Feeding Feeding Type: Bottle Fed - Formula Nipple Type: Slow - flow   Interventions Interventions: DEBP;Expressed milk;Breast compression  Lactation Tools Discussed/Used  DEBP   Consult Status Consult Status: PRN    [name] 02/04/2017, 1:35 PM

## 2017-02-04 NOTE — Discharge Summary (Signed)
OB Discharge Summary     Patient Name: Penny KennedyHeather C Arterberry DOB: Aug 24, 1985 MRN: [id number]  Date of admission: 02/02/2017 Delivering MD: Pincus LargePHELPS, JAZMA Y   Date of discharge: 02/04/2017  Admitting diagnosis: LABOR PP BTL Intrauterine pregnancy: 5272w6d     Secondary diagnosis:  Active Problems:   Normal labor   Post term pregnancy at 6241 weeks gestation - desires sterilization - polysubstance abuse; prev cocaine use; using Subutex now - no custody of other children  Additional problems: late to care (23wks)     Discharge diagnosis: Term Pregnancy Delivered                                                                                                Post partum procedures:postpartum tubal ligation  Augmentation: AROM and Pitocin  Complications: None  Hospital course:  Onset of Labor With Vaginal Delivery     31 y.o. yo [id number] at 5172w6d was admitted in Latent Labor on 02/02/2017. Patient had an uncomplicated labor course as follows:  Membrane Rupture Time/Date: 1:15 PM ,02/02/2017   Intrapartum Procedures: Episiotomy: None [1]                                         Lacerations:  None [1]  Patient had a delivery of a Viable infant. 02/02/2017  Information for the patient's newborn:  Ree EdmanWray, Girl Chaise [[id number]][[id number]]  Delivery Method: Vag-Spont    Pateint had an uncomplicated postpartum course.  She is ambulating, tolerating a regular diet, passing flatus, and urinating well. Patient is discharged home in stable condition on 02/04/17. Pt's discharge is still pending a SW consult. She was seen by SW prior to delivery, but there isn't a record of a plan for the baby in the chart, since pt has decided not to proceed with adoption plan. Will also have anesthesia eval pt prior to d/c due to back pain near epidural site.   Physical exam  Vitals:   02/03/17 1800 02/03/17 2151 02/04/17 0207 02/04/17 0630  BP: (!) 107/51 128/62 90/60 112/70  Pulse: 80 92 68 66  Resp: 16 18 16 18   Temp: 98.8  F (37.1 C) 98 F (36.7 C) 98 F (36.7 C) (!) 97.3 F (36.3 C)  TempSrc:  Oral Oral   SpO2: 97%     Weight:      Height:       General: alert and cooperative; having back/shoulder discomfort Lochia: appropriate Uterine Fundus: firm Incision: BTL incision dry, intact DVT Evaluation: No evidence of DVT seen on physical exam. Labs: Lab Results  Component Value Date   WBC 11.1 (H) 02/02/2017   HGB 12.6 02/02/2017   HCT 37.2 02/02/2017   MCV 88.4 02/02/2017   PLT 157 02/02/2017   CMP Latest Ref Rng & Units 02/12/2016  Glucose 65 - 99 mg/dL 244(W101(H)  BUN 6 - 20 mg/dL 12  Creatinine 1.020.44 - 7.251.00 mg/dL 3.660.70  Sodium 440135 - 347145 mmol/L 137  Potassium 3.5 - 5.1 mmol/L 3.9  Chloride 101 -  111 mmol/L 106  CO2 22 - 32 mmol/L 24  Calcium 8.9 - 10.3 mg/dL 1.6(X8.8(L)  Total Protein 6.5 - 8.1 g/dL -  Total Bilirubin 0.3 - 1.2 mg/dL -  Alkaline Phos 38 - 096126 U/L -  AST 15 - 41 U/L -  ALT 14 - 54 U/L -    Discharge instruction: per After Visit Summary and "Baby and Me Booklet".  After visit meds:  Allergies as of 02/04/2017   No Known Allergies     Medication List    STOP taking these medications   acetaminophen 325 MG tablet Commonly known as:  TYLENOL   IRON PO   nicotine 21 mg/24hr patch Commonly known as:  NICODERM CQ - dosed in mg/24 hours   pantoprazole 20 MG tablet Commonly known as:  PROTONIX     TAKE these medications   buprenorphine 8 MG Subl SL tablet Commonly known as:  SUBUTEX Place 8 mg under the tongue every 8 (eight) hours.   buPROPion 100 MG tablet Commonly known as:  WELLBUTRIN Take 1 tablet (100 mg total) by mouth 2 (two) times daily.   hydrocortisone cream 1 % Apply 1 application topically 4 (four) times daily as needed for itching.   ibuprofen 600 MG tablet Commonly known as:  ADVIL,MOTRIN Take 1 tablet (600 mg total) by mouth every 6 (six) hours as needed.   Prenatal Vitamin 27-0.8 MG Tabs Take 1 tablet by mouth daily.       Diet: routine  diet  Activity: Advance as tolerated. Pelvic rest for 6 weeks.   Outpatient follow up:4 weeks Follow up Appt:Future Appointments Date Time Provider Department Center  03/11/2017 11:00 AM Cresenzo-Dishmon, Scarlette CalicoFrances, CNM [facility]   Follow up Visit:No Follow-up on file.  Postpartum contraception: Tubal Ligation  Newborn Data: Live born female  Birth Weight: 7 lb 5.6 oz (3335 g) APGAR: 8, 9  Baby Feeding: Bottle Disposition:pending SW visit   02/04/2017 Cam HaiSHAW, Nadra Hritz, CNM  8:41 AM

## 2017-02-04 NOTE — Progress Notes (Signed)
CPS report accepted and assigned to [employer] [facility]  CSW will follow up with Ms. Helton tomorrow as she is not in the office today.  CSW provided MOB with update.  MOB tearful.  Support offered.

## 2017-02-05 ENCOUNTER — Encounter (HOSPITAL_COMMUNITY)
Admission: AD | Disposition: A | Discharge status: Home / Self Care | Payer: Self-pay | Source: Ambulatory Visit | Attending: Obstetrics & Gynecology

## 2017-02-05 ENCOUNTER — Inpatient Hospital Stay (HOSPITAL_COMMUNITY): Payer: Medicaid Other

## 2017-02-05 ENCOUNTER — Inpatient Hospital Stay (HOSPITAL_COMMUNITY): Payer: Medicaid Other | Admitting: Certified Registered Nurse Anesthetist

## 2017-02-05 ENCOUNTER — Encounter (HOSPITAL_COMMUNITY): Payer: Self-pay | Admitting: Certified Registered Nurse Anesthetist

## 2017-02-05 HISTORY — PX: RADIOLOGY WITH ANESTHESIA: SHX6223

## 2017-02-05 LAB — CBC WITH DIFFERENTIAL/PLATELET
Basophils Absolute: 0 10*3/uL (ref 0.0–0.1)
Basophils Relative: 0 %
Eosinophils Absolute: 0.3 10*3/uL (ref 0.0–0.7)
Eosinophils Relative: 3 %
HEMATOCRIT: 34.4 % — AB (ref 36.0–46.0)
HEMOGLOBIN: 11.3 g/dL — AB (ref 12.0–15.0)
LYMPHS ABS: 2.1 10*3/uL (ref 0.7–4.0)
LYMPHS PCT: 20 %
MCH: 29.8 pg (ref 26.0–34.0)
MCHC: 32.8 g/dL (ref 30.0–36.0)
MCV: 90.8 fL (ref 78.0–100.0)
Monocytes Absolute: 0.5 10*3/uL (ref 0.1–1.0)
Monocytes Relative: 5 %
NEUTROS ABS: 7.6 10*3/uL (ref 1.7–7.7)
NEUTROS PCT: 72 %
Platelets: 204 10*3/uL (ref 150–400)
RBC: 3.79 MIL/uL — AB (ref 3.87–5.11)
RDW: 14.2 % (ref 11.5–15.5)
WBC: 10.5 10*3/uL (ref 4.0–10.5)

## 2017-02-05 SURGERY — RADIOLOGY WITH ANESTHESIA
Anesthesia: General

## 2017-02-05 MED ORDER — OXYCODONE-ACETAMINOPHEN 5-325 MG PO TABS
1.0000 | ORAL_TABLET | Freq: Four times a day (QID) | ORAL | Status: DC | PRN
  Administered 2017-02-05: 2 via ORAL
  Filled 2017-02-05 (×2): qty 2

## 2017-02-05 MED ORDER — OXYCODONE HCL 5 MG PO TABS
5.0000 mg | ORAL_TABLET | Freq: Four times a day (QID) | ORAL | Status: DC | PRN
  Administered 2017-02-05: 10 mg via ORAL
  Administered 2017-02-06: 5 mg via ORAL
  Administered 2017-02-06 – 2017-02-07 (×4): 10 mg via ORAL
  Administered 2017-02-07: 5 mg via ORAL
  Administered 2017-02-08 (×3): 10 mg via ORAL
  Filled 2017-02-05 (×6): qty 2
  Filled 2017-02-05: qty 1
  Filled 2017-02-05 (×2): qty 2
  Filled 2017-02-05: qty 1
  Filled 2017-02-05 (×2): qty 2

## 2017-02-05 MED ORDER — CYCLOBENZAPRINE HCL 5 MG PO TABS
5.0000 mg | ORAL_TABLET | Freq: Three times a day (TID) | ORAL | Status: DC | PRN
  Administered 2017-02-06 – 2017-02-07 (×4): 5 mg via ORAL
  Filled 2017-02-05 (×6): qty 1

## 2017-02-05 MED ORDER — CEFAZOLIN SODIUM-DEXTROSE 2-4 GM/100ML-% IV SOLN
2.0000 g | Freq: Four times a day (QID) | INTRAVENOUS | Status: DC
  Administered 2017-02-05 – 2017-02-07 (×7): 2 g via INTRAVENOUS
  Filled 2017-02-05 (×7): qty 100

## 2017-02-05 MED ORDER — VANCOMYCIN HCL IN DEXTROSE 1-5 GM/200ML-% IV SOLN
1000.0000 mg | Freq: Three times a day (TID) | INTRAVENOUS | Status: DC
  Administered 2017-02-05 – 2017-02-07 (×6): 1000 mg via INTRAVENOUS
  Filled 2017-02-05 (×7): qty 200

## 2017-02-05 MED ORDER — IBUPROFEN 600 MG PO TABS
600.0000 mg | ORAL_TABLET | Freq: Four times a day (QID) | ORAL | Status: DC | PRN
  Administered 2017-02-06 (×4): 600 mg via ORAL
  Filled 2017-02-05 (×4): qty 1

## 2017-02-05 MED ORDER — GADOBENATE DIMEGLUMINE 529 MG/ML IV SOLN
15.0000 mL | Freq: Once | INTRAVENOUS | Status: AC | PRN
  Administered 2017-02-05: 15 mL via INTRAVENOUS

## 2017-02-05 MED ORDER — METHOCARBAMOL 500 MG PO TABS: 500.0000 mg | ORAL_TABLET | Freq: Three times a day (TID) | ORAL | 0 refills | Status: DC

## 2017-02-05 MED ORDER — FENTANYL CITRATE (PF) 100 MCG/2ML IJ SOLN
100.0000 ug | Freq: Once | INTRAMUSCULAR | Status: AC
  Administered 2017-02-05: 100 ug via INTRAVENOUS
  Filled 2017-02-05: qty 2

## 2017-02-05 MED ORDER — SULFAMETHOXAZOLE-TRIMETHOPRIM 800-160 MG PO TABS: 1.0000 | ORAL_TABLET | Freq: Two times a day (BID) | ORAL | 0 refills | Status: DC

## 2017-02-05 NOTE — Progress Notes (Signed)
Patient S/P MRI lumbar spine under general anesthesia.  Pt tolerated procedure well and MRI reviewed with radiology.  No signs of infection to epidural space and confined to soft tissues.  Both Neurosurgery and ID consulted.

## 2017-02-05 NOTE — Progress Notes (Signed)
Postpartum Note  Patient looks much more comfortable and just got out of the shower; she was ambulating in the hall prior.  Back inspected and erythema looks much improved vs when I saw her earlier tonight. The erythema just seems limited to a prior marked circle. There is a small pustle and this was swabbed and sent for aerobic and anaerobic culture and gauze and tegaderm applied. D/w her that herniated disc is likely a chronic issue and d/w NSU and will talk with patient further after infection clears as an outpatient.  Will continue to follow  Penny Hale, Jr MD Attending Center for [facility]) 02/05/2017 Time: 2223

## 2017-02-05 NOTE — Anesthesia Procedure Notes (Signed)
Procedure Name: Intubation Date/Time: 02/05/2017 1:38 PM Performed by: Rise PatienceBELL, Atlee Villers T Pre-anesthesia Checklist: Patient identified, Emergency Drugs available, Suction available and Patient being monitored Patient Re-evaluated:Patient Re-evaluated prior to induction Oxygen Delivery Method: Circle System Utilized Preoxygenation: Pre-oxygenation with 100% oxygen Induction Type: IV induction and Rapid sequence Ventilation: Mask ventilation without difficulty Laryngoscope Size: Miller and 2 Grade View: Grade I Tube type: Oral Tube size: 7.5 mm Number of attempts: 1 Airway Equipment and Method: Stylet and Oral airway Placement Confirmation: ETT inserted through vocal cords under direct vision,  positive ETCO2 and breath sounds checked- equal and bilateral Secured at: 22 cm Tube secured with: Tape Dental Injury: Teeth and Oropharynx as per pre-operative assessment

## 2017-02-05 NOTE — Addendum Note (Signed)
Addendum  created 02/05/17 [id number] by Phillips Groutarignan, Trenita Hulme, MD   Order list changed

## 2017-02-05 NOTE — Anesthesia Preprocedure Evaluation (Signed)
Anesthesia Evaluation  Patient identified by MRN, date of birth, ID band Patient awake    Reviewed: Allergy & Precautions, H&P , NPO status , Patient's Chart, lab work & pertinent test results  Airway Mallampati: I  TM Distance: >3 FB Neck ROM: full    Dental no notable dental hx.    Pulmonary neg pulmonary ROS, Current Smoker,    Pulmonary exam normal breath sounds clear to auscultation       Cardiovascular negative cardio ROS Normal cardiovascular exam Rhythm:regular Rate:Normal     Neuro/Psych Seizures -, Well Controlled,  PSYCHIATRIC DISORDERS Bipolar Disorder negative neurological ROS     GI/Hepatic negative GI ROS, Neg liver ROS, GERD  Medicated and Controlled,(+)     substance abuse  IV drug use,   Endo/Other  negative endocrine ROS  Renal/GU negative Renal ROS     Musculoskeletal negative musculoskeletal ROS (+)   Abdominal Normal abdominal exam  (+)   Peds  Hematology negative hematology ROS (+)   Anesthesia Other Findings S/p epidural with suspect cellulitis over site  Reproductive/Obstetrics (+) Pregnancy                            Anesthesia Physical  Anesthesia Plan  ASA: II and emergent  Anesthesia Plan: General   Post-op Pain Management:    Induction: Intravenous and Cricoid pressure planned  PONV Risk Score and Plan: 2 and Ondansetron, Dexamethasone and Treatment may vary due to age or medical condition  Airway Management Planned: Oral ETT  Additional Equipment:   Intra-op Plan:   Post-operative Plan: Extubation in OR  Informed Consent: I have reviewed the patients History and Physical, chart, labs and discussed the procedure including the risks, benefits and alternatives for the proposed anesthesia with the patient or authorized representative who has indicated his/her understanding and acceptance.   Dental advisory given and History available from chart  only  Plan Discussed with: Anesthesiologist and CRNA  Anesthesia Plan Comments: (  )       Anesthesia Quick Evaluation

## 2017-02-05 NOTE — Anesthesia Postprocedure Evaluation (Signed)
Anesthesia Post Note  Patient: Leroy KennedyHeather C Bily  Procedure(s) Performed: Procedure(s) (LRB): RADIOLOGY WITH ANESTHESIA - MRI LUMBAR SPINE WITH CONSTRAST (N/A)     Patient location during evaluation: PACU Anesthesia Type: General Level of consciousness: awake and alert and oriented Pain management: pain level controlled Vital Signs Assessment: post-procedure vital signs reviewed and stable Respiratory status: spontaneous breathing, nonlabored ventilation, respiratory function stable and patient connected to nasal cannula oxygen Cardiovascular status: blood pressure returned to baseline and stable Postop Assessment: no signs of nausea or vomiting Anesthetic complications: no Comments: Lumbosacral MRI reviewed with Radiologist, no epidural abscess noted, inflammatory changes noted L3-L4 at epidural site, confined to soft tissue. No fluid collections noted. Discussed with Dr. Vergie LivingPickens. Recommend ID consult to see what their recommendations are concerning IV antibiotic duration and transition to oral Abx.    Last Vitals:  Vitals:   02/05/17 0632 02/05/17 1450  BP: 116/66 114/68  Pulse: 68 89  Resp: 16 20  Temp: 36.7 C 36.6 C    Last Pain:  Vitals:   02/05/17 1450  TempSrc:   PainSc: Asleep                 Jayd Forrey A.

## 2017-02-05 NOTE — Progress Notes (Signed)
Postpartum Note  [facility] Anesthesia unsure if NSU or ID has been [facility] Patient states she's in severe pain at epidural site and is unable to really sit b/c of it. She can walk fine but when she lifts her left leg she does have pain that goes up and down her left leg and back  Gen: patient standing and upset Back: erythema at the former epidural site with small pustule  CBC Latest Ref Rng & Units 02/02/2017 10/16/2016 02/11/2016  WBC 4.0 - 10.5 K/uL 11.1(H) 8.4 7.9  Hemoglobin 12.0 - 15.0 g/dL 13.012.6 86.512.7 15.2(H)  Hematocrit 36.0 - 46.0 % 37.2 38.7 43.6  Platelets 150 - 400 K/uL 157 241 273    CBC with diff ordered and will swab her back and outline with pen Talked to Dr. Drue SecondSnider and recommends adding ancef to antibiotic regimen and will come see in AM Will try and page NSU again to see if anything further needs to be done re: her MRI   Cornelia Copaharlie Shaunessy Dobratz, Jr MD Attending Center for [facility] ([facility]ulty Practice) 02/05/2017 Time: 1842  D/w NSU Dr. Venetia MaxonStern and likely chronic and NTD right now. Can see patient as an outpatient  Bingharlie Sarit Sparano, Montez HagemanJr MD Attending Center for [facility] ([facility]) 02/05/2017 Time: [phone]

## 2017-02-05 NOTE — Addendum Note (Signed)
Addendum  created 02/05/17 0827 by Phillips Groutarignan, Althea Backs, MD   Sign clinical note

## 2017-02-05 NOTE — Progress Notes (Signed)
Pharmacy Antibiotic Note  Penny KennedyHeather C Spofford is a 31 y.o. female admitted on 02/02/2017 with epidural abscess.  Pharmacy has been consulted for vancomycin dosing. Patient received one dose of vancomycin 1g IV x 1 8/2 at 1051 for possible cellulitis at epidural site, now looking worse per MD assessment this am.  Plan: - Vancomycin 1g IV q8h - Follow up SCr, UOP, cultures, clinical course and adjust as clinically indicated   Height: 5\' 5"  (165.1 cm) Weight: 181 lb (82.1 kg) IBW/kg (Calculated) : 57  Temp (24hrs), Avg:97.9 F (36.6 C), Min:97.7 F (36.5 C), Max:98.2 F (36.8 C)   Recent Labs Lab 02/02/17 0815  WBC 11.1*    CrCl cannot be calculated (Patient's most recent lab result is older than the maximum 21 days allowed.).  CrCl estimated at >100 ml/min  No Known Allergies  Antimicrobials this admission: Vancomycin 1g IV x 1  Ib 8.1 at 1051  Dose adjustments this admission:   Microbiology results:  BCx:   UCx:   Sputum:    MRSA PCR:   Thank you for allowing pharmacy to be a part of this patient's care.  Drusilla KannerGrimsley, Mohit Zirbes Lydia 02/05/2017 7:49 AM

## 2017-02-05 NOTE — Discharge Summary (Signed)
Patient Name: Penny KennedyHeather C Hale DOB: 1985/10/06 MRN: [id number]  Date of admission: 02/02/2017 Delivering MD: Caryl AdaPHELPS, JAZMA Y   Date of discharge: 02/05/2017  Admitting diagnosis: LABOR PP BTL Intrauterine pregnancy: 6069w6d     Secondary diagnosis:  Active Problems:   Normal labor   Post term pregnancy at 741 weeks gestation - desires sterilization - polysubstance abuse; prev cocaine use; using Subutex now - no custody of other children  Additional problems: late to care (23wks), possible skin infection at epidural site                                      Discharge diagnosis: Term Pregnancy Delivered                                                                                                Post partum procedures:postpartum tubal ligation  Augmentation: AROM and Pitocin  Complications: None  Hospital course:  Onset of Labor With Vaginal Delivery     31 y.o. yo [id number] at 4969w6d was admitted in Latent Labor on 02/02/2017. Patient had an uncomplicated labor course as follows:  Membrane Rupture Time/Date: 1:15 PM ,02/02/2017   Intrapartum Procedures: Episiotomy: None [1]                                         Lacerations:  None [1]  Patient had a delivery of a Viable infant. 02/02/2017  Information for the patient's newborn:  Penny Hale, Girl Anaisabel [[id number]][[id number]]  Delivery Method: Vag-Spont    Pateint had an uncomplicated postpartum course.  She is ambulating, tolerating a regular diet, passing flatus, and urinating well. Patient is discharged home in stable condition on 02/04/17. Pt's discharge is still pending a SW consult. She was seen by SW prior to delivery, but there isn't a record of a plan for the baby in the chart, since pt has decided not to proceed with adoption plan. Will also have anesthesia eval pt prior to d/c due to back pain near epidural site.   Physical exam        Vitals:   02/03/17 1800 02/03/17 2151 02/04/17 0207 02/04/17 0630  BP: (!) 107/51 128/62 90/60  112/70  Pulse: 80 92 68 66  Resp: 16 18 16 18   Temp: 98.8 F (37.1 C) 98 F (36.7 C) 98 F (36.7 C) (!) 97.3 F (36.3 C)  TempSrc:  Oral Oral   SpO2: 97%     Weight:      Height:       General: alert and cooperative; having back/shoulder discomfort Lochia: appropriate Uterine Fundus: firm Incision: BTL incision dry, intact DVT Evaluation: No evidence of DVT seen on physical exam. Labs: RecentLabs       Lab Results  Component Value Date   WBC 11.1 (H) 02/02/2017   HGB 12.6 02/02/2017   HCT 37.2 02/02/2017   MCV 88.4 02/02/2017   PLT 157 02/02/2017  CMP Latest Ref Rng & Units 02/12/2016  Glucose 65 - 99 mg/dL 540(J101(H)  BUN 6 - 20 mg/dL 12  Creatinine 8.110.44 - 9.141.00 mg/dL 7.820.70  Sodium 956135 - 213145 mmol/L 137  Potassium 3.5 - 5.1 mmol/L 3.9  Chloride 101 - 111 mmol/L 106  CO2 22 - 32 mmol/L 24  Calcium 8.9 - 10.3 mg/dL 0.8(M8.8(L)  Total Protein 6.5 - 8.1 g/dL -  Total Bilirubin 0.3 - 1.2 mg/dL -  Alkaline Phos 38 - 578126 U/L -  AST 15 - 41 U/L -  ALT 14 - 54 U/L -    Discharge instruction: per After Visit Summary and "Baby and Me Booklet".  After visit meds:  Allergies as of 02/04/2017   No Known Allergies             Medication List     STOP taking these medications   acetaminophen 325 MG tablet Commonly known as:  TYLENOL   IRON PO   nicotine 21 mg/24hr patch Commonly known as:  NICODERM CQ - dosed in mg/24 hours   pantoprazole 20 MG tablet Commonly known as:  PROTONIX     TAKE these medications   buprenorphine 8 MG Subl SL tablet Commonly known as:  SUBUTEX Place 8 mg under the tongue every 8 (eight) hours.   buPROPion 100 MG tablet Commonly known as:  WELLBUTRIN Take 1 tablet (100 mg total) by mouth 2 (two) times daily.   hydrocortisone cream 1 % Apply 1 application topically 4 (four) times daily as needed for itching.   ibuprofen 600 MG tablet Commonly known as:  ADVIL,MOTRIN Take 1 tablet (600 mg total) by mouth  every 6 (six) hours as needed.   Prenatal Vitamin 27-0.8 MG Tabs Take 1 tablet by mouth daily.     Bactrim DS BID for 2 weeks  Diet: routine diet  Activity: Advance as tolerated. Pelvic rest for 6 weeks.   Outpatient follow up:4 weeks Follow up Appt:Future Appointments Date Time Provider Department Center  03/11/2017 11:00 AM Cresenzo-Dishmon, Scarlette CalicoFrances, CNM [facility]   Follow up Visit:No Follow-up on file.  Postpartum contraception: Tubal Ligation  Newborn Data: Live born female  Birth Weight: 7 lb 5.6 oz (3335 g) APGAR: 8, 9  Baby Feeding: Bottle Disposition:pending SW visit

## 2017-02-05 NOTE — Transfer of Care (Signed)
Immediate Anesthesia Transfer of Care Note  Patient: Penny Hale  Procedure(s) Performed: Procedure(s): RADIOLOGY WITH ANESTHESIA - MRI LUMBAR SPINE WITH CONSTRAST (N/A)  Patient Location: PACU  Anesthesia Type:General  Level of Consciousness: awake, alert  and oriented  Airway & Oxygen Therapy: Patient Spontanous Breathing  Post-op Assessment: Report given to RN, Post -op Vital signs reviewed and stable and Patient moving all extremities X 4  Post vital signs: Reviewed and stable  Last Vitals:  Vitals:   02/05/17 0632 02/05/17 1450  BP: 116/66 114/68  Pulse: 68 89  Resp: 16 20  Temp: 36.7 C 36.6 C    Last Pain:  Vitals:   02/05/17 1450  TempSrc:   PainSc: Asleep         Complications: No apparent anesthesia complications

## 2017-02-05 NOTE — H&P (Signed)
Anesthesia H&P Update: History and Physical Exam reviewed; patient is OK for planned anesthetic and procedure. ? ?

## 2017-02-05 NOTE — Progress Notes (Signed)
S/p labor epidural on 7/31.  H/o MRSA and drug abuse.  Last night c/o back pain at insertion site. + erythema. Started on IV Vancomycin and PO Bactrim.  On exam this AM, pain is worse. 1.5 cm skin erythema with subcutaneous infection noted. Indurated 3cm around insertion site. Localized pain at site plus referred pain to upper back and legs.  In light of patients history, this is very worrisome for possible epidural abscess.  Stat MRI back ordered. Dr Despina HiddenEure aware. Will not d/c to home at this time. May need neurosurgical consult or I&D.   [employer] Corporation[employer]ignan

## 2017-02-06 ENCOUNTER — Encounter (HOSPITAL_COMMUNITY): Payer: Self-pay | Admitting: Radiology

## 2017-02-06 DIAGNOSIS — Y848 Other medical procedures as the cause of abnormal reaction of the patient, or of later complication, without mention of misadventure at the time of the procedure: Secondary | ICD-10-CM

## 2017-02-06 DIAGNOSIS — O8689 Other specified puerperal infections: Secondary | ICD-10-CM

## 2017-02-06 DIAGNOSIS — L02212 Cutaneous abscess of back [any part, except buttock]: Secondary | ICD-10-CM

## 2017-02-06 DIAGNOSIS — Z9889 Other specified postprocedural states: Secondary | ICD-10-CM

## 2017-02-06 DIAGNOSIS — L03312 Cellulitis of back [any part except buttock]: Secondary | ICD-10-CM

## 2017-02-06 NOTE — Progress Notes (Signed)
LATE ENTRY:  02/03/2017 Day shift 0700-1500.  Pt with C/O back pain during shift.  I recommended rest and gave her Ice pak for epidural site.,  Site WNL, no redness, swelling or leaking.  Dressing CD&I.  She received her Subutex, motrin and tylenol for pain. Pt up ambulating every 2 hrs out of building to smoke.  When I transported pt to OR for her BTL on Tuesday 02/03/17 at 1500. ( I also gave a verbal report earlier in the shift via phone, concerning pt's NPO status to OR RN.  Despite being informed she was NPO she had eaten pizza at 3am and hashbrowns at 0630 that morning. When Dr. Adrian BlackwaterStinson went in to get the consent signed he thought she was sipping on a sprite.  I went in and emptied all drinks and removed cups from room,  I did give her oral meds with small sips of water per MD's approval.  She stated she just kept forgetting. I reported this to the OR RN) .  Pt became tearful as we transported her to OR.  I informed CRNA about the back pain, she also assessed site, and said it looked WNL.  Dr. Jean RosenthalJackson was also made aware.

## 2017-02-06 NOTE — Consult Note (Signed)
[facility]  Total days of antibiotics 3        Day 3 vanco        Day 2 cefazolin               Reason for Consult: superficial skin infection at epidural site    Referring Physician: eure  Active Problems:   Normal labor   Post term pregnancy at [redacted] weeks gestation    HPI: Penny KennedyHeather C Hale is a 31 y.o. female who is [id number] at 5729w6d was admitted in Latent Laboron 02/02/2017. Patient had an uncomplicated labor course with a delivery of a viableinfant. The patient had an epidural placed during her labor but then started to develop significant back pain,and erythema with small pustule at epidural site after she had her tubal ligation on 7/31.anesthesia evaluated patient on 8/1 noticed that she had erythema and tenderness at the site of epidural.She was started on vanco then cefazolin yesterday. Pustule swabbed with gram stain showing gpc in pairs. Unclear if she is colonized with MRSA. Area of inflammation is improving but back pain remains significant at the injection site as well as radiating down left leg.  IMPRESSION: Epidural catheter tract visible entering posterior to L3 and extending to the L3-4 interlaminar region. Mild edema and enhancement along the track without evidence of focal abscess. No evidence of extension of the inflammatory change into the spinal canal. No epidural abscess or hematoma.  T12-L1 large central disc herniation indents the thecal sac and could cause neural compression.  L1-2 broad-based right posterolateral prominent disc herniation indents the thecal sac and crowds the nerve roots of the cauda equina.  Central disc herniation at L5-S1 contacts the thecal sac and the S1 nerves without distinct neural compression.  Past Medical History:  Diagnosis Date  . ADHD (attention deficit hyperactivity disorder)    self-cutting  . Bipolar 1 disorder (HCC)   . Manic-depressive (HCC)   . Polysubstance abuse    cocaine,  opiates, benzos, marijuana  . Seizures (HCC)    drug related  . Trichomonal vaginitis     Allergies: No Known Allergies  MEDICATIONS: . buprenorphine  8 mg Sublingual TID  . buPROPion  100 mg Oral BID  . methocarbamol  500 mg Oral TID  . nicotine  21 mg Transdermal Daily  . prenatal multivitamin  1 tablet Oral Q1200  . senna-docusate  2 tablet Oral Q24H  . Tdap  0.5 mL Intramuscular Once    Social History  Substance Use Topics  . Smoking status: Current Every Day Smoker    Packs/day: 0.25    Years: 12.00    Types: Cigarettes  . Smokeless tobacco: Never Used  . Alcohol use Yes     Comment: alcohol use in early pregnancy    Family History  Problem Relation Age of Onset  . COPD Mother   . Emphysema Mother   . Mental illness Sister   . Depression Sister   . Mental illness Brother   . Depression Brother   . ADD / ADHD Son     Review of Systems  Constitutional: Negative for fever, chills, diaphoresis, activity change, appetite change, fatigue and unexpected weight change.  HENT: Negative for congestion, sore throat, rhinorrhea, sneezing, trouble swallowing and sinus pressure.  Eyes: Negative for photophobia and visual disturbance.  Respiratory: Negative for cough, chest tightness, shortness of breath, wheezing and stridor.  Cardiovascular: Negative for chest pain, palpitations and leg swelling.  Gastrointestinal: Negative for  nausea, vomiting, abdominal pain, diarrhea, constipation, blood in stool, abdominal distention and anal bleeding.  Genitourinary: Negative for dysuria, hematuria, flank pain and difficulty urinating.  Musculoskeletal: back pain+.Negative for myalgias,  joint swelling, arthralgias and gait problem.  Skin: back wound drainage Neurological: Negative for dizziness, tremors, weakness and light-headedness.  Hematological: Negative for adenopathy. Does not bruise/bleed easily.  Psychiatric/Behavioral: Negative for behavioral problems, confusion, sleep  disturbance, dysphoric mood, decreased concentration and agitation.     OBJECTIVE: Temp:  [97.8 F (36.6 C)-98.1 F (36.7 C)] 97.8 F (36.6 C) (08/03 0553) Pulse Rate:  [65-90] 65 (08/03 0553) Resp:  [14-20] 16 (08/03 0553) BP: (95-114)/(60-69) 103/62 (08/03 0553) SpO2:  [95 %-97 %] 96 % (08/02 1620) Physical Exam  Constitutional:  oriented to person, place, and time. appears well-developed and well-nourished. No distress.  HENT: [place]/AT, PERRLA, no scleral icterus.poor dentition Mouth/Throat: Oropharynx is clear and moist. No oropharyngeal exudate.  Cardiovascular: Normal rate, regular rhythm and normal heart sounds. Exam reveals no gallop and no friction rub.  No murmur heard.  Pulmonary/Chest: Effort normal and breath sounds normal. No respiratory distress.  has no wheezes.  Neck = supple, no nuchal rigidity Abdominal: Soft. Bowel sounds are normal.  exhibits no distension. There is no tenderness.  Back: 3cm circular erythema with pinpoint purulent drainage. Can express pus when placing mild pressure superiorly Lymphadenopathy: no cervical adenopathy. No axillary adenopathy Ext: lower extremity +1 edema Neurological: alert and oriented to person, place, and time.  Skin: Skin is warm and dry. No rash noted. No erythema.  Psychiatric: a normal mood and affect.  tearful  LABS: Results for orders placed or performed during the hospital encounter of 02/02/17 (from the past 48 hour(s))  CBC with Differential/Platelet     Status: Abnormal   Collection Time: 02/05/17  7:07 PM  Result Value Ref Range   WBC 10.5 4.0 - 10.5 K/uL   RBC 3.79 (L) 3.87 - 5.11 MIL/uL   Hemoglobin 11.3 (L) 12.0 - 15.0 g/dL   HCT 16.134.4 (L) 09.636.0 - 04.546.0 %   MCV 90.8 78.0 - 100.0 fL   MCH 29.8 26.0 - 34.0 pg   MCHC 32.8 30.0 - 36.0 g/dL   RDW 40.914.2 81.111.5 - 91.415.5 %   Platelets 204 150 - 400 K/uL   Neutrophils Relative % 72 %   Neutro Abs 7.6 1.7 - 7.7 K/uL   Lymphocytes Relative 20 %   Lymphs Abs 2.1 0.7 - 4.0  K/uL   Monocytes Relative 5 %   Monocytes Absolute 0.5 0.1 - 1.0 K/uL   Eosinophils Relative 3 %   Eosinophils Absolute 0.3 0.0 - 0.7 K/uL   Basophils Relative 0 %   Basophils Absolute 0.0 0.0 - 0.1 K/uL  Aerobic/Anaerobic Culture (surgical/deep wound)     Status: None (Preliminary result)   Collection Time: 02/05/17 10:00 PM  Result Value Ref Range   Specimen Description WOUND    Special Requests Normal    Gram Stain      RARE WBC PRESENT, PREDOMINANTLY PMN RARE GRAM POSITIVE COCCI IN PAIRS Performed at [facility], [address] [place], [place] [address]    Culture PENDING    Report Status PENDING     MICRO: Wound cx GPC in prs IMAGING: Mr Lumbar Spine W Wo Contrast  Result Date: 02/05/2017 CLINICAL DATA:  Epidural for childbirth and subsequent tubal ligation. Development of redness along the epidural site. Extreme back pain extending to both legs. EXAM: MRI LUMBAR SPINE WITH CONTRAST  TECHNIQUE: Lumbar scanning is performed before and after contrast administration. CONTRAST:  15 cc MultiHance COMPARISON:  None. FINDINGS: Segmentation:  5 lumbar type vertebral bodies. Alignment: Increased kyphotic curvature at the thoracolumbar junction. Vertebrae:  No fracture or primary bone lesion. Conus medullaris: Extends to the L1 level and appears normal. Paraspinal and other soft tissues: Epidural catheter track visible entering posterior L3, entering the L3-4 interspinous space. There is edema and enhancement along the course of the catheter, but only within the posterior soft tissues. There is no sign of epidural abscess or hematoma. Disc levels: T12-L1: Central to left paracentral disc herniation indents the thecal sac and could cause neural compression. L1-2: Broad-based disc herniation more prominent towards the right indents the thecal sac, crowding the nerve roots of the cauda equina. This could be symptomatic. L2-3:  Normal. L3-4:  Mild disc bulge.  No stenosis. L4-5:  Normal  interspace. L5-S1: Central disc herniation contacts the thecal sac and the S1 nerves but does not cause visible neural compression. IMPRESSION: Epidural catheter tract visible entering posterior to L3 and extending to the L3-4 interlaminar region. Mild edema and enhancement along the track without evidence of focal abscess. No evidence of extension of the inflammatory change into the spinal canal. No epidural abscess or hematoma. T12-L1 large central disc herniation indents the thecal sac and could cause neural compression. L1-2 broad-based right posterolateral prominent disc herniation indents the thecal sac and crowds the nerve roots of the cauda equina. Central disc herniation at L5-S1 contacts the thecal sac and the S1 nerves without distinct neural compression. Electronically Signed   By: Paulina Fusi M.D.   On: 02/05/2017 14:44    HISTORICAL MICRO/IMAGING  Assessment/Plan:  31yo F who had latent labor at nearly 41W, had developed post delivery erythema/cellulitis at epidural injection site that is improving on Iv abtx. Wound cx are still pending. This is likely a superficial SSTI likely with MSSA or MRSA. Wound cx is pending  - recommend to wait for identification of bacteria on wound culture and sensitivities. If MSSA then treat for 10 days with cephalexin 500mg  QID.  - if cx show MRSA then will need to see if clindamycin is sensitive on the susceptibility testing. Doxy and bactrim would have higher risk of side effects on infant, and if we decide to use those abtx, would make recommendations to avoid breastfeeding and "pump and dump".   - continue with daily-bid dressing changes. Make sure that tract remains open for drainage  Back pain = defer to primary team to managing pain

## 2017-02-06 NOTE — Progress Notes (Signed)
Pt sitting upright eating post shower and states she feels better.

## 2017-02-06 NOTE — Progress Notes (Signed)
MD and RN at bedside discussing plan of care with the pt. Pt continues to have severe pain in mid right side of back and at epidural site. Dr. Discussed the continued use of atbx and awaiting  information from Infection Control who assessed pt earlier today

## 2017-02-06 NOTE — Progress Notes (Signed)
Post Partum Day 4 Subjective:  Patient has near full recovery from delivery and BTL. However, she still reports 8+ pain on her back at and around the side. She is tearful when thinking about her pain. Only improved with standing.    Objective: Blood pressure 103/62, pulse 65, temperature 97.8 F (36.6 C), temperature source Oral, resp. rate 16, height 5\' 5"  (1.651 m), weight 82.1 kg (181 lb), SpO2 96 %, unknown if currently breastfeeding.  Physical Exam:  General: alert and mild distress Lochia: appropriate Incision: healing well  MSK: weakness on hip flexion 2/2 to pain. Tender to touch on ribs lateral to center of lower back. No erythema.    Recent Labs  02/05/17 1907  HGB 11.3*  HCT 34.4*   Imaging:   IMPRESSION: Epidural catheter tract visible entering posterior to L3 and extending to the L3-4 interlaminar region. Mild edema and enhancement along the track without evidence of focal abscess. No evidence of extension of the inflammatory change into the spinal canal. No epidural abscess or hematoma.  T12-L1 large central disc herniation indents the thecal sac and could cause neural compression.  L1-2 broad-based right posterolateral prominent disc herniation indents the thecal sac and crowds the nerve roots of the cauda equina.  Central disc herniation at L5-S1 contacts the thecal sac and the S1 nerves without distinct neural compression.   Electronically Signed   By: Paulina FusiMark  Shogry M.D.   On: 02/05/2017 14:44   Assessment/Plan: Contraception BTL done  Postpartum: minimal abdominal pain; patient doing well.  Pain: Likely musculoskeletal in nature, unable to comment on significance of disc herniation as cause.  - Methocarbamol TID  -Flexeril 5 TID prn  -  Oxycodone 5-10mg  q6PRN; More or more frequent opioids not likely to help as patient also take subutex for opioid dependence.  - Considering Neurosurgery or ortho consult. Giving time for swelling to go down  first.   ID: Treating for cellulitis. ID rec to wait for culture growth and susceptibility for changes in abx regimen.    LOS: 4 days   Ignacia MarvelKendrick C White, MD 02/06/2017, 5:19 PM     Attestation of Attending Supervision of Resident: Evaluation and management procedures were performed by the [facility]Family Medicine Resident under my supervision.  I confirm that I have verified the information documented in the resident's note, and that I have also personally reperformed the physical exam and all medical decision making activities.   Jaynie CollinsUGONNA  ANYANWU, MD, FACOG Attending Obstetrician & Gynecologist Faculty Practice, [facility] [place]

## 2017-02-07 LAB — BASIC METABOLIC PANEL
Anion gap: 9 (ref 5–15)
BUN: 8 mg/dL (ref 6–20)
CALCIUM: 8.6 mg/dL — AB (ref 8.9–10.3)
CO2: 26 mmol/L (ref 22–32)
CREATININE: 0.55 mg/dL (ref 0.44–1.00)
Chloride: 101 mmol/L (ref 101–111)
Glucose, Bld: 103 mg/dL — ABNORMAL HIGH (ref 65–99)
Potassium: 3.7 mmol/L (ref 3.5–5.1)
SODIUM: 136 mmol/L (ref 135–145)

## 2017-02-07 MED ORDER — KETOROLAC TROMETHAMINE 30 MG/ML IJ SOLN
30.0000 mg | Freq: Four times a day (QID) | INTRAMUSCULAR | Status: AC | PRN
  Administered 2017-02-07 – 2017-02-08 (×4): 30 mg via INTRAMUSCULAR
  Filled 2017-02-07 (×3): qty 1

## 2017-02-07 MED ORDER — CLINDAMYCIN HCL 300 MG PO CAPS: 600.0000 mg | ORAL_CAPSULE | Freq: Three times a day (TID) | ORAL | Status: DC

## 2017-02-07 MED ORDER — CLINDAMYCIN HCL 300 MG PO CAPS
300.0000 mg | ORAL_CAPSULE | Freq: Four times a day (QID) | ORAL | Status: DC
  Administered 2017-02-07 – 2017-02-08 (×5): 300 mg via ORAL
  Filled 2017-02-07 (×9): qty 1

## 2017-02-07 MED ORDER — KETOROLAC TROMETHAMINE 30 MG/ML IJ SOLN
30.0000 mg | Freq: Four times a day (QID) | INTRAMUSCULAR | Status: DC | PRN
  Administered 2017-02-07: 30 mg via INTRAVENOUS
  Filled 2017-02-07 (×2): qty 1

## 2017-02-07 MED ORDER — CEPHALEXIN 500 MG PO CAPS
500.0000 mg | ORAL_CAPSULE | Freq: Four times a day (QID) | ORAL | Status: DC
  Administered 2017-02-07 – 2017-02-08 (×5): 500 mg via ORAL
  Filled 2017-02-07 (×9): qty 1

## 2017-02-07 NOTE — Progress Notes (Signed)
Pharmacy has been consulted for vancomycin dosing.  Pt is currently receiving vancomycin 1000mg  Q8 hours. Checked SCr this AM. SCr= 0.55. Will continue current dosing regimen. Will consider getting vancomycin level if continued today based on culture and ID recommendations.    Penny Hale 02/07/2017,7:54 AM

## 2017-02-07 NOTE — Plan of Care (Signed)
Problem: Skin Integrity: Goal: Demonstration of wound healing without infection will improve Outcome: Progressing When starting Ancef, Pt reported that she felt a burning pain at IV site.  No swelling or redness noted at the site but pt reports the area is tender to touch.  Fluids unhooked and IV flushed.  Pt verbalized pain and burning at IV site when flushed.  D/t previous infiltration of IV on Rt arm, RN removed IV and notified MD.  MD told RN that to hold off on inserting another one because they were discussing switching antibiotics to oral.  Pt made aware and verbalized understanding.  Epidural site on lower back covered with dressing.  Dressing clean/dry/intact.  No redness spreading out from site.  Some bruising seen through the tape holding the dressing.  Pt reports site is very tender.  Will continue to monitor.

## 2017-02-07 NOTE — Progress Notes (Signed)
Infectious Disease Progress Note  S:  Question to whether patient has IV infiltration. Since afebrile, can switch to oral abtx:  Labs: cultures still pending  - will d/c cefazolin and start oral cephalexin - will d/c vanco and switch to clindamycin  Plan to treat for a total of 10 days with orals.   Duke Salviaynthia B. Drue SecondSnider MD MPH [facility] [phone]

## 2017-02-08 ENCOUNTER — Encounter: Payer: Self-pay | Admitting: Obstetrics & Gynecology

## 2017-02-08 DIAGNOSIS — L089 Local infection of the skin and subcutaneous tissue, unspecified: Secondary | ICD-10-CM

## 2017-02-08 DIAGNOSIS — A4902 Methicillin resistant Staphylococcus aureus infection, unspecified site: Secondary | ICD-10-CM | POA: Insufficient documentation

## 2017-02-08 DIAGNOSIS — Z9851 Tubal ligation status: Secondary | ICD-10-CM

## 2017-02-08 MED ORDER — SULFAMETHOXAZOLE-TRIMETHOPRIM 800-160 MG PO TABS: 1.0000 | ORAL_TABLET | Freq: Two times a day (BID) | ORAL | 0 refills | Status: AC

## 2017-02-08 MED ORDER — CEPHALEXIN 500 MG PO CAPS
500.0000 mg | ORAL_CAPSULE | Freq: Four times a day (QID) | ORAL | 0 refills | Status: DC
Start: 2017-02-08 — End: 2017-02-08

## 2017-02-08 MED ORDER — OXYCODONE HCL 5 MG PO TABS: 5.0000 mg | ORAL_TABLET | Freq: Four times a day (QID) | ORAL | 0 refills | Status: AC | PRN

## 2017-02-08 MED ORDER — IBUPROFEN 600 MG PO TABS: 600.0000 mg | ORAL_TABLET | Freq: Four times a day (QID) | ORAL | 2 refills | Status: AC | PRN

## 2017-02-08 MED ORDER — METHOCARBAMOL 500 MG PO TABS: 500.0000 mg | ORAL_TABLET | Freq: Three times a day (TID) | ORAL | 2 refills | Status: AC

## 2017-02-08 MED ORDER — CEPHALEXIN 500 MG PO CAPS: 500.0000 mg | ORAL_CAPSULE | Freq: Four times a day (QID) | ORAL | 0 refills | Status: DC

## 2017-02-08 MED ORDER — CLINDAMYCIN HCL 300 MG PO CAPS: 300.0000 mg | ORAL_CAPSULE | Freq: Four times a day (QID) | ORAL | 0 refills | Status: AC

## 2017-02-08 MED ORDER — CLINDAMYCIN HCL 300 MG PO CAPS: 300.0000 mg | ORAL_CAPSULE | Freq: Four times a day (QID) | ORAL | 0 refills | Status: DC

## 2017-02-08 NOTE — Plan of Care (Signed)
Problem: Skin Integrity: Goal: Demonstration of wound healing without infection will improve Outcome: Progressing Wound site on lower back cover with occlusive dsg and Tegaderm that is clean/dry/intact.  Patient states the site is tender to touch, but she c/o pain mostly at the right lower butt bone.  Per patient the pain is more of pressure like. Will continue to monitor the wound site and pain.

## 2017-02-08 NOTE — Discharge Summary (Signed)
OB Discharge Summary     Patient Name: Penny KennedyHeather C Hale DOB: October 27, 1985 MRN: [id number]  Date of admission: 02/02/2017 Delivering MD: Caryl AdaPHELPS, JAZMA Y   Date of discharge: 02/08/2017  Admitting diagnosis: LABOR PP BTL lumbar spine with constrast Intrauterine pregnancy: 3851w6d     Secondary diagnosis:  Principal Problem:   Vaginal delivery Active Problems:   Post term pregnancy at [redacted] weeks gestation   Status post tubal ligation   Superficial skin infection      Discharge diagnosis: Term Pregnancy Delivered                                                                                                Post partum procedures:postpartum tubal ligation  Augmentation: AROM and Pitocin  Complications: epidural site skin infection  Hospital course:  Onset of Labor With Vaginal Delivery     31 y.o. yo [id number] at 3351w6d was admitted in Active Labor on 02/02/2017. Patient had an uncomplicated labor course requiring augmentation as above. Delivery details:  Membrane Rupture Time/Date: 1:15 PM ,02/02/2017   Intrapartum Procedures: Episiotomy: None [1]                                         Lacerations:  None [1]  Patient had a delivery of a Viable infant. 02/02/2017  Information for the patient's newborn:  Justin MendWray, Journey [[id number]][[id number]]  Delivery Method: Vag-Spont    Pateint had a postpartum course complicated by epidural site skin infection. She had MRI which ruled out abscess. Patient initially with difficul to control pain, but improved significantly by day of discharge. Infectious disease was consulted for antibiotic recommendations. Patient was initially on IV Vancomycin and Ancef, and she was started on po bactrim. On 02/07/17, wound culture was growing staph, and ID switched antibiotics to Keflex and clindamycin, and recommended a total of 10 days of po antibiotics. Pt was discharged on 02/08/17.   She is ambulating, tolerating a regular diet, passing flatus, and urinating well. Patient is  discharged home in stable condition on 02/08/17.   Physical exam  Vitals:   02/06/17 1813 02/07/17 0513 02/07/17 1828 02/08/17 0628  BP: 120/79 106/64 127/73 106/71  Pulse: 80 67 75 68  Resp: 17 15 18 18   Temp: 97.7 F (36.5 C) 98.2 F (36.8 C) 98.5 F (36.9 C) 98.3 F (36.8 C)  TempSrc: Oral Oral Oral Oral  SpO2:  97%    Weight:      Height:       General: alert, cooperative and no distress Lochia: appropriate Uterine Fundus: firm Incision: Healing well with no significant drainage DVT Evaluation: No evidence of DVT seen on physical exam. No significant calf/ankle edema. Labs: Lab Results  Component Value Date   WBC 10.5 02/05/2017   HGB 11.3 (L) 02/05/2017   HCT 34.4 (L) 02/05/2017   MCV 90.8 02/05/2017   PLT 204 02/05/2017   CMP Latest Ref Rng & Units 02/07/2017  Glucose 65 - 99 mg/dL 130(Q103(H)  BUN 6 - 20  mg/dL 8  Creatinine 1.610.44 - 0.961.00 mg/dL 0.450.55  Sodium 409135 - 811145 mmol/L 136  Potassium 3.5 - 5.1 mmol/L 3.7  Chloride 101 - 111 mmol/L 101  CO2 22 - 32 mmol/L 26  Calcium 8.9 - 10.3 mg/dL 9.1(Y8.6(L)  Total Protein 6.5 - 8.1 g/dL -  Total Bilirubin 0.3 - 1.2 mg/dL -  Alkaline Phos 38 - 782126 U/L -  AST 15 - 41 U/L -  ALT 14 - 54 U/L -    Discharge instruction: per After Visit Summary and "Baby and Me Booklet".  After visit meds:  Allergies as of 02/08/2017   No Known Allergies     Medication List    STOP taking these medications   acetaminophen 325 MG tablet Commonly known as:  TYLENOL   IRON PO   nicotine 21 mg/24hr patch Commonly known as:  NICODERM CQ - dosed in mg/24 hours   pantoprazole 20 MG tablet Commonly known as:  PROTONIX     TAKE these medications   buprenorphine 8 MG Subl SL tablet Commonly known as:  SUBUTEX Place 8 mg under the tongue every 8 (eight) hours.   buPROPion 100 MG tablet Commonly known as:  WELLBUTRIN Take 1 tablet (100 mg total) by mouth 2 (two) times daily.   cephALEXin 500 MG capsule Commonly known as:  KEFLEX Take 1  capsule (500 mg total) by mouth every 6 (six) hours.   clindamycin 300 MG capsule Commonly known as:  CLEOCIN Take 1 capsule (300 mg total) by mouth every 6 (six) hours.   hydrocortisone cream 1 % Apply 1 application topically 4 (four) times daily as needed for itching.   ibuprofen 600 MG tablet Commonly known as:  ADVIL,MOTRIN Take 1 tablet (600 mg total) by mouth every 6 (six) hours as needed.   methocarbamol 500 MG tablet Commonly known as:  ROBAXIN Take 1 tablet (500 mg total) by mouth 3 (three) times daily.   oxyCODONE 5 MG immediate release tablet Commonly known as:  Oxy IR/ROXICODONE Take 1-2 tablets (5-10 mg total) by mouth every 6 (six) hours as needed for severe pain.   Prenatal Vitamin 27-0.8 MG Tabs Take 1 tablet by mouth daily.       Diet: routine diet  Activity: Advance as tolerated. Pelvic rest for 6 weeks.   Outpatient follow up:2 weeks Follow up Appt: Future Appointments Date Time Provider Department Center  03/11/2017 11:00 AM Cresenzo-Dishmon, Scarlette CalicoFrances, CNM [facility]   Postpartum contraception: Tubal Ligation  Newborn Data: Live born female  Birth Weight: 7 lb 5.6 oz (3335 g) APGAR: 8, 9  Baby Feeding: Bottle Disposition:home with mother   02/08/2017 Frederik PearJulie P Degele, MD   Attestation of Attending Supervision of Obstetric Fellow: Evaluation and management procedures were performed by the Obstetric Fellow under my supervision and collaboration.  I have reviewed the Obstetric Fellow's note and chart, and I agree with the management and plan.  Jaynie CollinsUGONNA  ANYANWU, MD, FACOG Attending Obstetrician & Gynecologist Faculty Practice, [facility] [place]

## 2017-02-08 NOTE — Progress Notes (Signed)
No need for contact precautions at this time per Degele MD and not requested by Infectious Disease per the note dated 02/07/17.

## 2017-02-08 NOTE — Progress Notes (Signed)
Patient's cultures resulted with MRSA; antibiotic changed to Bactrim DS 1 t ab po bid x 10 days. New Rx given to patient. Told to take in lieu of Keflex, continue Clindamycin as prescribed.   Penny CollinsUGONNA  Danette Weinfeld, MD, FACOG Attending Obstetrician & Gynecologist, [facility], [facility]

## 2017-02-08 NOTE — Progress Notes (Signed)
CSW spoke with [place] DSS on-call worker Emily Pulliam/[phone] to make her aware of MOB's discharge. Emily request that baby be held at the hospital until Monday so that the worker assigned to the case can follow-up and ensure safe discharge plan is in place re: baby and MOB's supervised visitation arrangement. CSW spoke with Jenny Riddle, NP with central nursery who notes they will hold baby for another night pending CPS follow-up tomorrow.   Shandrea Lusk, MSW, LCSW-A Clinical Social Worker  [facility]  Office: [phone] 

## 2017-02-08 NOTE — Progress Notes (Addendum)
POSTPARTUM PROGRESS NOTE  Post Partum Day 5  Subjective:  Penny Hale is a 31 y.o. now [id number] 1768w6d s/p SVD on 7/30 and ppBTL on 02/03/17. Postpartum course complicated by epidural site infection. Patient report still not feeling well, but has no new concerns.   Objective: Blood pressure 127/73, pulse 75, temperature 98.5 F (36.9 C), temperature source Oral, resp. rate 18, height 5\' 5"  (1.651 m), weight 181 lb (82.1 kg), SpO2 97 %, unknown if currently breastfeeding.  Physical Exam:  General: alert, cooperative and no distress Lochia:normal flow Chest: no respiratory distress Heart:regular rate, distal pulses intact Abdomen: soft, nontender,  Uterine Fundus: firm, appropriately tender DVT Evaluation: No calf swelling or tenderness MSK: moving all 4 extremities w/o difficulty   Recent Labs  02/05/17 1907  HGB 11.3*  HCT 34.4*    Assessment/Plan:  ASSESSMENT: Penny KennedyHeather C Hale is a 31 y.o. [id number] 3068w6d s/p SVD and BTL. Postpartum course prolonged by epidural site infection.  --S/p Vancomycin and Ancef and bactrim. ID has switched to Keflex and clindamycin (wound cx growing Gram+ cocci in pairs). Plan to treat for total of 10 day of oral abx.  Plan for discharge this afternoon   LOS: 6 days   Raynelle FanningJulie P. Syrai Gladwin, MD OB Fellow

## 2017-02-11 LAB — AEROBIC/ANAEROBIC CULTURE W GRAM STAIN (SURGICAL/DEEP WOUND)

## 2017-02-11 LAB — AEROBIC/ANAEROBIC CULTURE (SURGICAL/DEEP WOUND): SPECIAL REQUESTS: NORMAL

## 2017-02-16 ENCOUNTER — Ambulatory Visit: Payer: Medicaid Other | Admitting: Obstetrics and Gynecology

## 2017-02-17 ENCOUNTER — Telehealth: Payer: Self-pay | Admitting: Obstetrics and Gynecology

## 2017-02-17 NOTE — Telephone Encounter (Signed)
Left message letting pt know the Wellbutrin was [facility] at [facility] [place] on 01/30/17. If she uses [facility] now, have them transfer prescription. JSY

## 2017-02-17 NOTE — Telephone Encounter (Signed)
Wellbutrin was filled on 01/30/17 at [facility] [place]. Left message letting pt know can have prescription transferred to [facility]. JSY

## 2017-02-17 NOTE — Telephone Encounter (Signed)
Pt called stating that she would like a refill of her welbutrion, Pt states that she has an appointment on 02/23/2017.Pt states that she would like this medication called to [facility]. Please contact pt

## 2017-02-17 NOTE — Telephone Encounter (Signed)
Left message x 1. JSY 

## 2017-02-17 NOTE — Telephone Encounter (Signed)
RF on Wellbutrin. Can send to [facility]

## 2017-02-23 ENCOUNTER — Ambulatory Visit (INDEPENDENT_AMBULATORY_CARE_PROVIDER_SITE_OTHER): Payer: Medicaid Other | Admitting: Obstetrics and Gynecology

## 2017-02-23 DIAGNOSIS — F1920 Other psychoactive substance dependence, uncomplicated: Secondary | ICD-10-CM

## 2017-02-23 NOTE — Progress Notes (Signed)
   Complete physical exam  Patient: Penny Hale   DOB: 04/26/2000   31 y.o. Female  MRN: [id number]  Subjective:    No chief complaint on file.   Penny Hale is a 31 y.o. female who presents today for a complete physical exam. She reports consuming a {diet types:17450} diet. {types:19826} She generally feels {DESC; WELL/FAIRLY WELL/POORLY:18703}. She reports sleeping {DESC; WELL/FAIRLY WELL/POORLY:18703}. She {does/does not:200015} have additional problems to discuss today.    Most recent fall risk assessment:    01/01/2022   10:42 AM  Fall Risk   Falls in the past year? 0  Number falls in past yr: 0  Injury with Fall? 0  Risk for fall due to : No Fall Risks  Follow up Falls evaluation completed     Most recent depression screenings:    01/01/2022   10:42 AM 11/22/2020   10:46 AM  PHQ 2/9 Scores  PHQ - 2 Score 0 0  PHQ- 9 Score 5     {VISON DENTAL STD PSA (Optional):27386}  {History (Optional):23778}  Patient Care Team: Jessup, Joy, NP as PCP - General (Nurse Practitioner)   Outpatient Medications Prior to Visit  Medication Sig   fluticasone (FLONASE) 50 MCG/ACT nasal spray Place 2 sprays into both nostrils in the morning and at bedtime. After 7 days, reduce to once daily.   norgestimate-ethinyl estradiol (SPRINTEC 28) 0.25-35 MG-MCG tablet Take 1 tablet by mouth daily.   Nystatin POWD Apply liberally to affected area 2 times per day   spironolactone (ALDACTONE) 100 MG tablet Take 1 tablet (100 mg total) by mouth daily.   No facility-administered medications prior to visit.    ROS        Objective:     There were no vitals taken for this visit. {Vitals History (Optional):23777}  Physical Exam   No results found for any visits on 02/06/22. {Show previous labs (optional):23779}    Assessment & Plan:    Routine Health Maintenance and Physical Exam  Immunization History  Administered Date(s) Administered   DTaP 07/10/1999, 09/05/1999,  11/14/1999, 07/30/2000, 02/13/2004   Hepatitis A 12/10/2007, 12/15/2008   Hepatitis B 04/27/1999, 06/04/1999, 11/14/1999   HiB (PRP-OMP) 07/10/1999, 09/05/1999, 11/14/1999, 07/30/2000   IPV 07/10/1999, 09/05/1999, 05/04/2000, 02/13/2004   Influenza,inj,Quad PF,6+ Mos 03/17/2014   Influenza-Unspecified 06/16/2012   MMR 05/04/2001, 02/13/2004   Meningococcal Polysaccharide 12/15/2011   Pneumococcal Conjugate-13 07/30/2000   Pneumococcal-Unspecified 11/14/1999, 01/28/2000   Tdap 12/15/2011   Varicella 05/04/2000, 12/10/2007    Health Maintenance  Topic Date Due   HIV Screening  Never done   Hepatitis C Screening  Never done   INFLUENZA VACCINE  02/04/2022   PAP-Cervical Cytology Screening  02/06/2022 (Originally 04/25/2020)   PAP SMEAR-Modifier  02/06/2022 (Originally 04/25/2020)   TETANUS/TDAP  02/06/2022 (Originally 12/14/2021)   HPV VACCINES  Discontinued   COVID-19 Vaccine  Discontinued    Discussed health benefits of physical activity, and encouraged her to engage in regular exercise appropriate for her age and condition.  Problem List Items Addressed This Visit   None Visit Diagnoses     Annual physical exam    -  Primary   Cervical cancer screening       Need for Tdap vaccination          No follow-ups on file.     Joy Jessup, NP   

## 2017-02-23 NOTE — Patient Instructions (Signed)
AAAAAAAAAAAAAAAAAAAAAAAAAAAAAAAAAAAAAAAAAAAAAAAAAAAAAAAAAAAAAAAAAAAAAAAAAAAAAAAAAAAAAAAAAAAAAAAAAAAAAAAAAAAAAAAAAAAAAAAAAAAAAAAAAAAAA

## 2017-03-04 ENCOUNTER — Emergency Department (HOSPITAL_COMMUNITY)
Admission: EM | Admit: 2017-03-04 | Discharge: 2017-03-04 | Disposition: A | Discharge status: Home / Self Care | Payer: Medicaid Other | Attending: Emergency Medicine | Admitting: Emergency Medicine

## 2017-03-04 ENCOUNTER — Encounter (HOSPITAL_COMMUNITY): Payer: Self-pay

## 2017-03-04 DIAGNOSIS — F17210 Nicotine dependence, cigarettes, uncomplicated: Secondary | ICD-10-CM | POA: Diagnosis not present

## 2017-03-04 DIAGNOSIS — F1910 Other psychoactive substance abuse, uncomplicated: Secondary | ICD-10-CM | POA: Diagnosis not present

## 2017-03-04 DIAGNOSIS — Z79899 Other long term (current) drug therapy: Secondary | ICD-10-CM | POA: Diagnosis not present

## 2017-03-04 DIAGNOSIS — F419 Anxiety disorder, unspecified: Secondary | ICD-10-CM | POA: Diagnosis present

## 2017-03-04 LAB — BASIC METABOLIC PANEL
Anion gap: 11 (ref 5–15)
BUN: 8 mg/dL (ref 6–20)
CALCIUM: 8.9 mg/dL (ref 8.9–10.3)
CO2: 24 mmol/L (ref 22–32)
Chloride: 106 mmol/L (ref 101–111)
Creatinine, Ser: 0.75 mg/dL (ref 0.44–1.00)
GFR calc Af Amer: >60 mL/min (ref >60)
Glucose, Bld: 142 mg/dL — ABNORMAL HIGH (ref 65–99)
POTASSIUM: 3.5 mmol/L (ref 3.5–5.1)
SODIUM: 141 mmol/L (ref 135–145)

## 2017-03-04 LAB — CBC WITH DIFFERENTIAL/PLATELET
BASOS ABS: 0 10*3/uL (ref 0.0–0.1)
Basophils Relative: 1 %
EOS ABS: 0.2 10*3/uL (ref 0.0–0.7)
EOS PCT: 3 %
HCT: 41.2 % (ref 36.0–46.0)
Hemoglobin: 13.6 g/dL (ref 12.0–15.0)
LYMPHS ABS: 2 10*3/uL (ref 0.7–4.0)
Lymphocytes Relative: 26 %
MCH: 29.4 pg (ref 26.0–34.0)
MCHC: 33 g/dL (ref 30.0–36.0)
MCV: 89 fL (ref 78.0–100.0)
Monocytes Absolute: 0.3 10*3/uL (ref 0.1–1.0)
Monocytes Relative: 4 %
Neutro Abs: 5.1 10*3/uL (ref 1.7–7.7)
Neutrophils Relative %: 66 %
PLATELETS: 273 10*3/uL (ref 150–400)
RBC: 4.63 MIL/uL (ref 3.87–5.11)
RDW: 14.2 % (ref 11.5–15.5)
WBC: 7.7 10*3/uL (ref 4.0–10.5)

## 2017-03-04 LAB — ETHANOL

## 2017-03-04 NOTE — ED Triage Notes (Signed)
Pt says she wants detox to get off drugs so she can get her child back.  Pt repeatedly state she is not SI or HI.

## 2017-03-04 NOTE — ED Triage Notes (Signed)
Pt reports she used crack Saturday and social services took her 191 month old child away.  Pt emotional.  Pt went to get her prescription for suboxone but they didn't give it to her because they told her to come be evaluated at hospital first.  Pt denies SI or HI.

## 2017-03-04 NOTE — ED Notes (Signed)
Pt has a friend with her.  Pt and her friend are in waiting room behind triage because no rooms available at this time.

## 2017-03-04 NOTE — ED Provider Notes (Signed)
[facility]PT Provider Note   CSN: [id number] Arrival date & time: 03/04/17  1226     History   Chief Complaint Chief Complaint  Patient presents with  . Anxiety  . V70.1    HPI Penny Hale is a 31 y.o. female.  She is here for evaluation of her psychiatric condition and polysubstance abuse.  Decided to come here to see if her medicines could be adjusted, because she relapsed on cocaine several days ago and subsequently her 52 month old infant was "taken from me."  She states social services relinquished her parental privileges, after she tested positive for cocaine.  She is also been abusing Xanax.  She was using cocaine during pregnancy as well.  She came here with a friend.  She last saw her psychiatrist who prescribes her psychiatric medicines, one week ago and has a follow-up appointment, in 5 days.  She denies homicidal or suicidal ideation. She denies fever, chills, cough, chest pain, weakness or dizziness.  HPI  Past Medical History:  Diagnosis Date  . ADHD (attention deficit hyperactivity disorder)    self-cutting  . Bipolar 1 disorder (HCC)   . Manic-depressive (HCC)   . Polysubstance abuse    cocaine, opiates, benzos, marijuana  . Seizures (HCC)    drug related  . Trichomonal vaginitis     Patient Active Problem List   Diagnosis Date Noted  . Status post tubal ligation 02/08/2017  . Superficial skin infection 02/08/2017  . MRSA (methicillin resistant Staphylococcus aureus) infection 02/08/2017  . Vaginal delivery 02/02/2017  . Post term pregnancy at [redacted] weeks gestation 02/02/2017  . Supervision of high risk pregnancy d/t cocaine use, antepartum 10/16/2016  . History of pyelonephritis during pregnancy 10/16/2016  . Family history of birth defect 10/16/2016  . Smoker 10/16/2016  . Depression with anxiety 10/16/2016  . 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 Cocaine abuse 02/12/2016  .  Opioid use disorder, moderate, dependence (HCC) 02/12/2016  . Polysubstance dependence including opioid type drug without complication, continuous use (HCC) 02/11/2016  . GERD (gastroesophageal reflux disease) 12/20/2013  . Benzodiazepine abuse 10/03/2013  . Marijuana use 10/03/2013    Past Surgical History:  Procedure Laterality Date  . DILATION AND CURETTAGE OF UTERUS    . RADIOLOGY WITH ANESTHESIA N/A 02/05/2017   Procedure: RADIOLOGY WITH ANESTHESIA - MRI LUMBAR SPINE WITH CONSTRAST;  Surgeon: Radiologist, Medication, MD;  Location: [facility] OR;  Service: Radiology;  Laterality: N/A;  . TUBAL LIGATION N/A 02/03/2017   Procedure: POST PARTUM TUBAL LIGATION;  Surgeon: Levie Heritage, DO;  Location: [facility];  Service: Gynecology;  Laterality: N/A;    OB History    Gravida Para Term Preterm AB Living   8 6 6   2 6    SAB TAB Ectopic Multiple Live Births   2     0 6       Home Medications    Prior to Admission medications   Medication Sig Start Date End Date Taking? Authorizing Provider  buprenorphine (SUBUTEX) 8 MG SUBL SL tablet Place 8 mg under the tongue every 8 (eight) hours.    Yes [provider]  buPROPion (WELLBUTRIN) 100 MG tablet Take 1 tablet (100 mg total) by mouth 2 (two) times daily. 01/30/17  Yes Lazaro Arms, MD  methocarbamol (ROBAXIN) 500 MG tablet Take 1 tablet (500 mg total) by mouth 3 (three) times daily. 02/08/17  Yes Ignacia Marvel, MD  oxyCODONE (OXY  IR/ROXICODONE) 5 MG immediate release tablet Take 1-2 tablets (5-10 mg total) by mouth every 6 (six) hours as needed for severe pain. 02/08/17  Yes Anyanwu, Jethro Bastos, MD  ibuprofen (ADVIL,MOTRIN) 600 MG tablet Take 1 tablet (600 mg total) by mouth every 6 (six) hours as needed. 02/08/17   Ignacia Marvel, MD  Prenatal Vit-Fe Fumarate-FA (PRENATAL VITAMIN) 27-0.8 MG TABS Take 1 tablet by mouth daily. 01/30/17   Lazaro Arms, MD    Family History Family History  Problem Relation Age of Onset  .  COPD Mother   . Emphysema Mother   . Mental illness Sister   . Depression Sister   . Mental illness Brother   . Depression Brother   . ADD / ADHD Son     Social History Social History  Substance Use Topics  . Smoking status: Current Every Day Smoker    Packs/day: 0.25    Years: 12.00    Types: Cigarettes  . Smokeless tobacco: Never Used  . Alcohol use Yes     Comment: alcohol use in early pregnancy     Allergies   Patient has no known allergies.   Review of Systems Review of Systems  All other systems reviewed and are negative.    Physical Exam Updated Vital Signs BP (!) 122/94 (BP Location: Right Arm)   Pulse 61   Temp (!) 97.5 F (36.4 C) (Oral)   Resp 18   Ht 5\' 5"  (1.651 m)   Wt 68 kg (150 lb)   SpO2 98%   Breastfeeding? No   BMI 24.96 kg/m   Physical Exam  Constitutional: She is oriented to person, place, and time. She appears well-developed. No distress.  She appears under nourished, and disheveled  HENT:  Head: Normocephalic and atraumatic.  Eyes: Pupils are equal, round, and reactive to light. Conjunctivae and EOM are normal.  Neck: Normal range of motion and phonation normal. Neck supple.  Cardiovascular: Normal rate and regular rhythm.   Pulmonary/Chest: Effort normal and breath sounds normal. She exhibits no tenderness.  Musculoskeletal: Normal range of motion.  Neurological: She is alert and oriented to person, place, and time. She exhibits normal muscle tone.  Skin: Skin is warm and dry.  Psychiatric: She has a normal mood and affect. Her behavior is normal. Judgment and thought content normal.  Nursing note and vitals reviewed.    ED Treatments / Results  Labs (all labs ordered are listed, but only abnormal results are displayed) Labs Reviewed  BASIC METABOLIC PANEL - Abnormal; Notable for the following:       Result Value   Glucose, Bld 142 (*)    All other components within normal limits  CBC WITH DIFFERENTIAL/PLATELET  ETHANOL    RAPID URINE DRUG SCREEN, HOSP PERFORMED    EKG  EKG Interpretation None       Radiology No results found.  Procedures Procedures (including critical care time)  Medications Ordered in ED Medications - No data to display   Initial Impression / Assessment and Plan / ED Course  I have reviewed the triage vital signs and the nursing notes.  Pertinent labs & imaging results that were available during my care of the patient were reviewed by me and considered in my medical decision making (see chart for details).      Patient Vitals for the past 24 hrs:  BP Temp Temp src Pulse Resp SpO2 Height Weight  03/04/17 1235 (!) 122/94 (!) 97.5 F (36.4 C) Oral 61  18 98 % 5\' 5"  (1.651 m) 68 kg (150 lb)    3:15 PM Reevaluation with update and discussion. After initial assessment and treatment, an updated evaluation reveals there is no change in clinical status.  Findings discussed with the patient and all questions answered. Trinadee Verhagen L      Final Clinical Impressions(s) / ED Diagnoses   Final diagnoses:  Polysubstance abuse    Polysubstance abuse, with parenteral right issues, but no acute unstable psychiatric conditions.  No indication for further ER treatment, hospitalization or urgent psychiatric intervention.  She has a follow-up appointment scheduled in 5 days with her psychiatrist.   Nursing Notes Reviewed/ Care Coordinated Applicable Imaging Reviewed Interpretation of Laboratory Data incorporated into ED treatment  The patient appears reasonably screened and/or stabilized for discharge and I doubt any other medical condition or other [facility] requiring further screening, evaluation, or treatment in the ED at this time prior to discharge.  Plan: Home Medications-OTC, as needed; Home Treatments-avoid all illegal drugs; return here if the recommended treatment, does not improve the symptoms; Recommended follow up-psychiatry follow-up as scheduled    New  Prescriptions New Prescriptions   No medications on file     Mancel Bale, MD 03/04/17 1517

## 2017-03-04 NOTE — Discharge Instructions (Signed)
Follow-up with your psychiatrist next Monday as scheduled for evaluation and treatment.  Avoid all illegal drugs.

## 2017-03-11 ENCOUNTER — Ambulatory Visit: Payer: Medicaid Other | Admitting: Advanced Practice Midwife

## 2017-03-12 ENCOUNTER — Ambulatory Visit: Payer: Medicaid Other | Admitting: Advanced Practice Midwife

## 2019-01-04 IMAGING — MR MR LUMBAR SPINE W/ CM
4 of 7 series · 18 of 48 positions shown · IV contrast (multihance)
Comparison: None.

CLINICAL DATA: Epidural for childbirth and subsequent tubal
ligation. Development of redness along the epidural site. Extreme
back pain extending to both legs.

EXAM:
MRI LUMBAR SPINE WITH CONTRAST
TECHNIQUE: Lumbar scanning is performed before and after contrast
administration.
CONTRAST:  15 cc MultiHance

[Series 3: T2 · sagittal · 4.0mm · 0.55mm/px · 3 of 13 slices shown (1 of 2)]
[im 1/13]
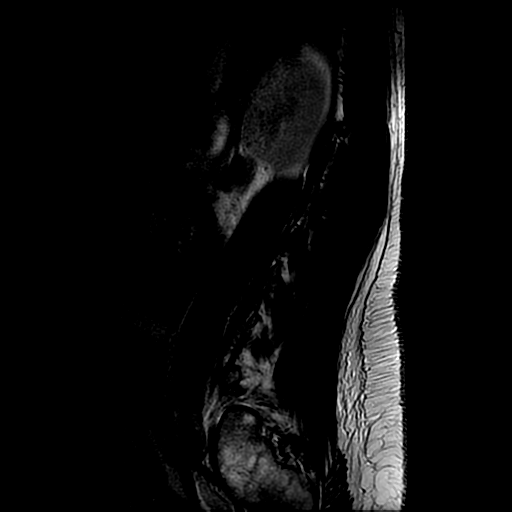
[im 7/13]
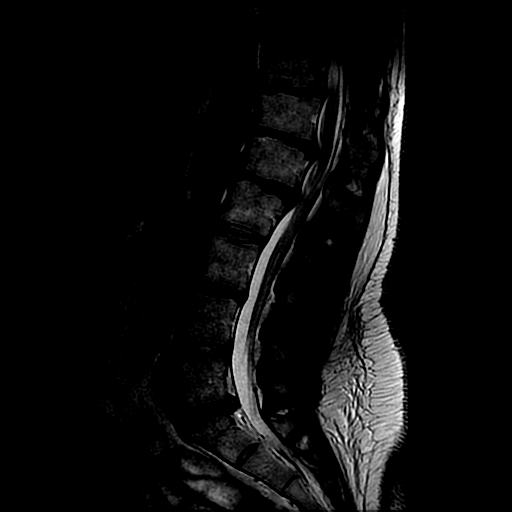
[im 13/13]
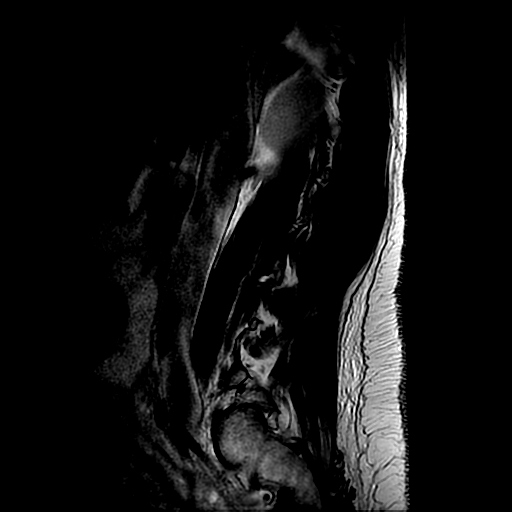

[Series 4: T1 · sagittal · 4.0mm · 0.55mm/px · 3 of 13 slices shown (1 of 2)]
[im 1/13]
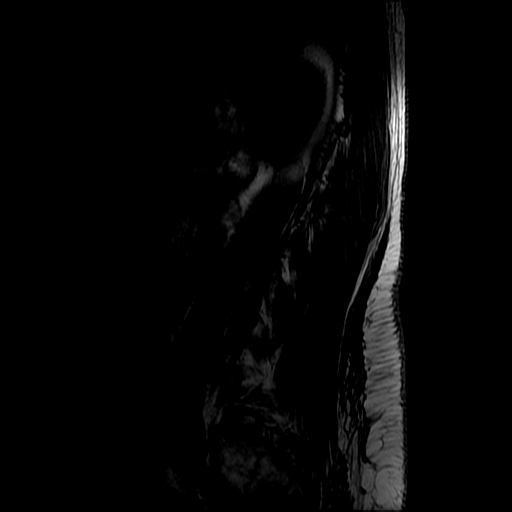
[im 9/13]
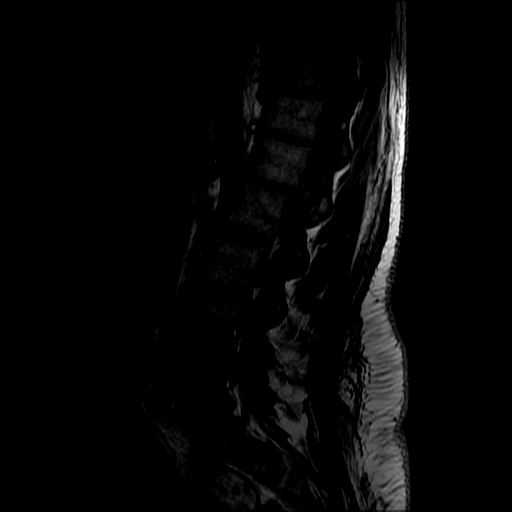
[im 13/13]
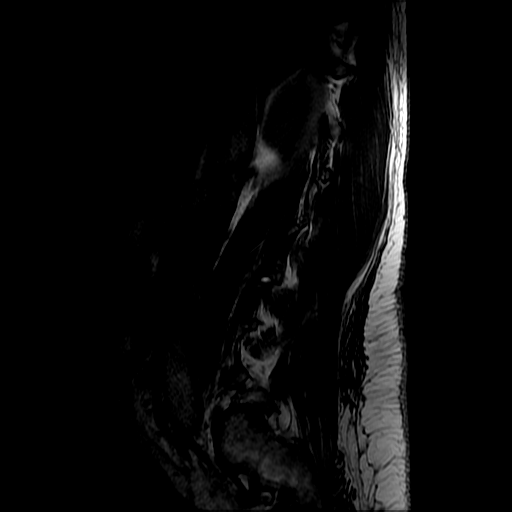

[Series 6: T2 · axial · 4.0mm · 0.39mm/px · z∈[-12,+169]mm · 9 of 36 slices shown (2 of 2)]
[im 1/36]
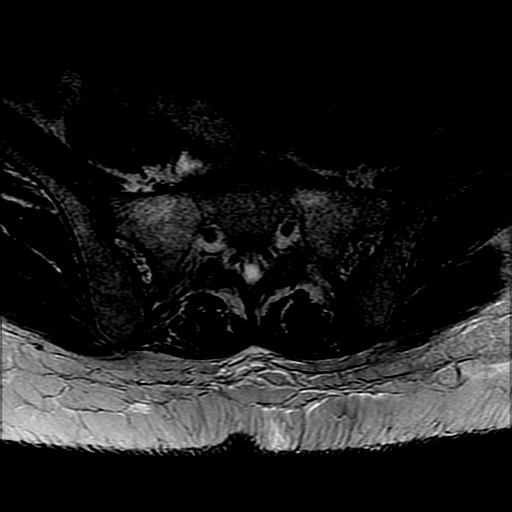
[im 4/36]
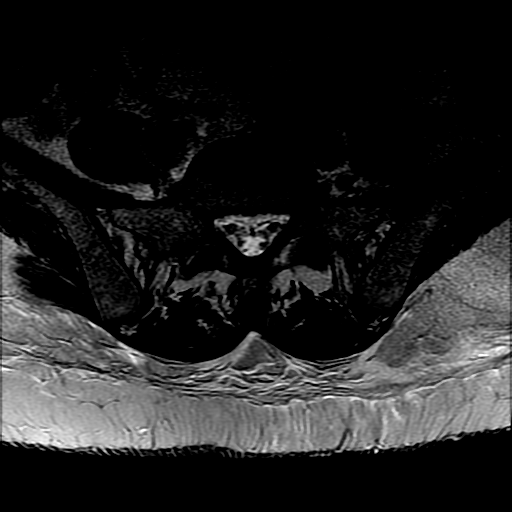
[im 8/36]
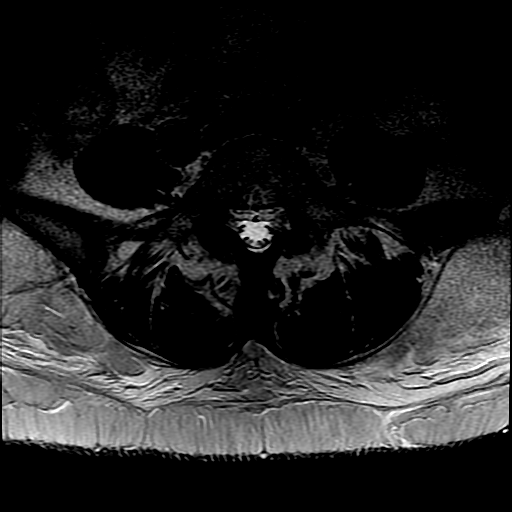
[im 11/36]
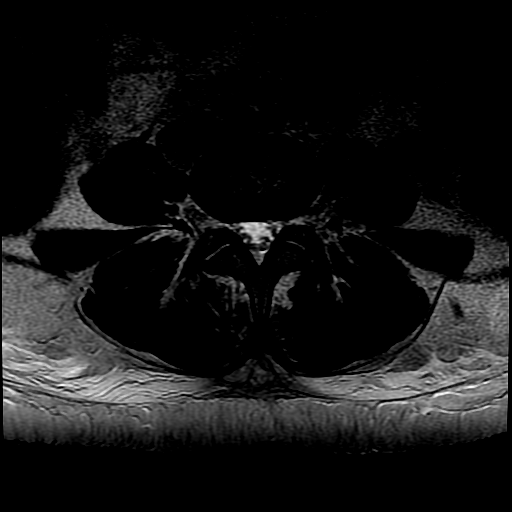
[im 15/36]
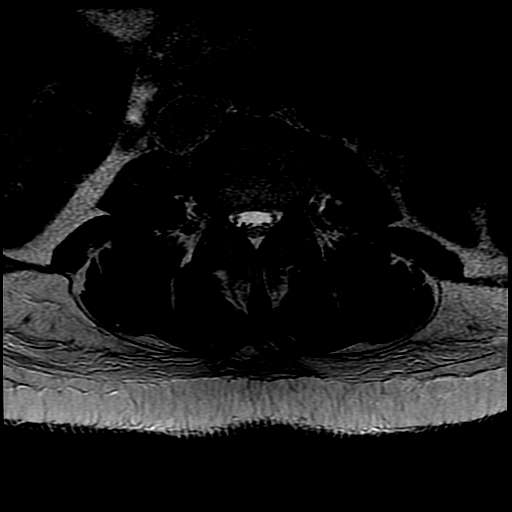
[im 18/36]
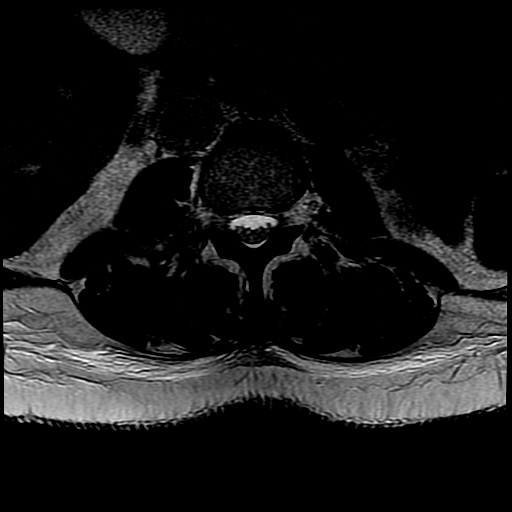
[im 22/36]
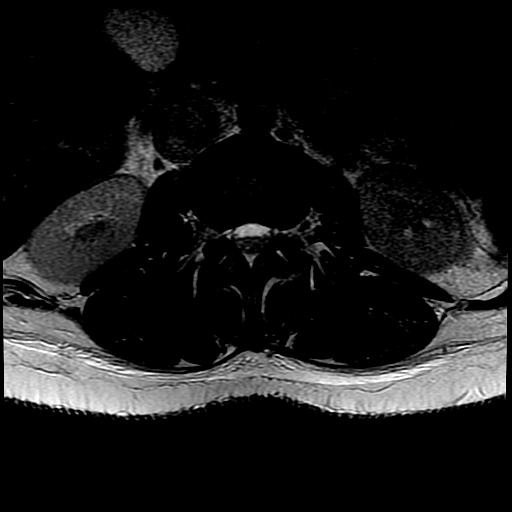
[im 25/36]
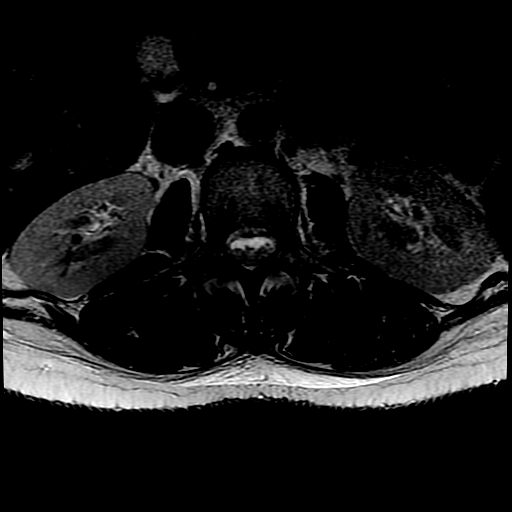
[im 32/36]
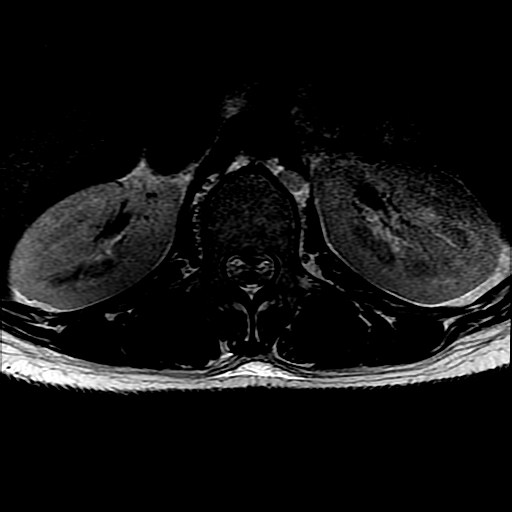

[Series 7: T1 · axial · 4.0mm · 0.39mm/px · z∈[+3,+169]mm · 3 of 36 slices shown (2 of 2)]
[im 4/36]
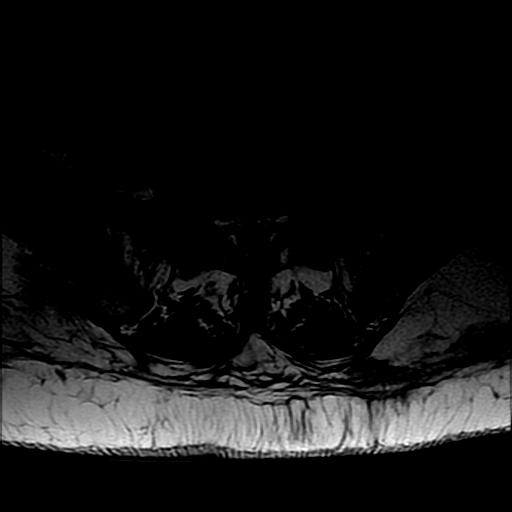
[im 18/36]
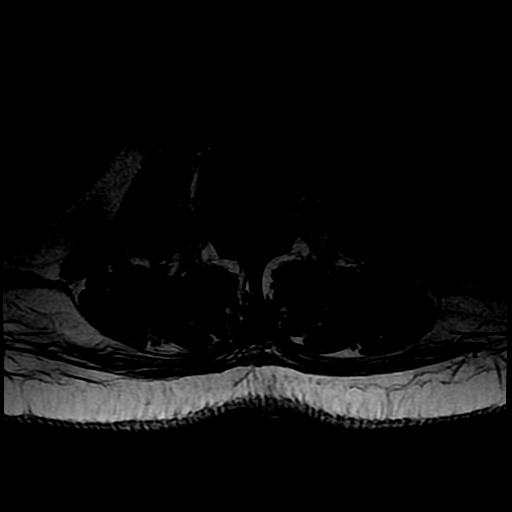
[im 32/36]
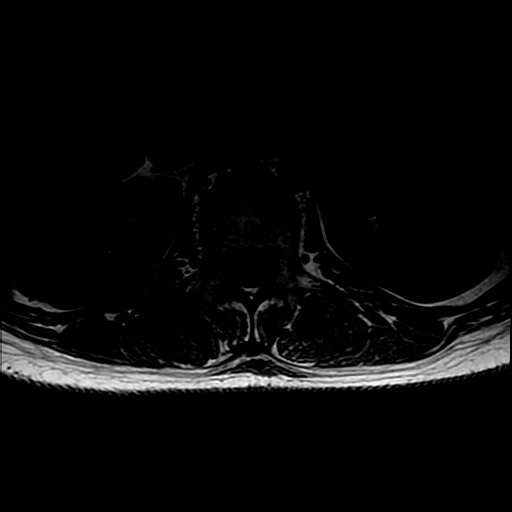

[18 of 48 positions shown; findings below may reference images not displayed]

FINDINGS: Segmentation:  5 lumbar type vertebral bodies.

Alignment: Increased kyphotic curvature at the thoracolumbar
junction.

Vertebrae:  No fracture or primary bone lesion.

Conus medullaris: Extends to the L1 level and appears normal.

Paraspinal and other soft tissues: Epidural catheter track visible
entering posterior L3, entering the L3-4 interspinous space. There
is edema and enhancement along the course of the catheter, but only
within the posterior soft tissues. There is no sign of epidural
abscess or hematoma.

Disc levels:

T12-L1: Central to left paracentral disc herniation indents the
thecal sac and could cause neural compression.

L1-2: Broad-based disc herniation more prominent towards the right
indents the thecal sac, crowding the nerve roots of the cauda
equina. This could be symptomatic.

L2-3:  Normal.

L3-4:  Mild disc bulge.  No stenosis.

L4-5:  Normal interspace.

L5-S1: Central disc herniation contacts the thecal sac and the S1
nerves but does not cause visible neural compression.
IMPRESSION: Epidural catheter tract visible entering posterior to L3 and
extending to the L3-4 interlaminar region. Mild edema and
enhancement along the track without evidence of focal abscess. No
evidence of extension of the inflammatory change into the spinal
canal. No epidural abscess or hematoma.

T12-L1 large central disc herniation indents the thecal sac and
could cause neural compression.

L1-2 broad-based right posterolateral prominent disc herniation
indents the thecal sac and crowds the nerve roots of the cauda
equina.

Central disc herniation at L5-S1 contacts the thecal sac and the S1
nerves without distinct neural compression.

## 2021-05-18 ENCOUNTER — Encounter (HOSPITAL_COMMUNITY): Payer: Self-pay | Admitting: *Deleted

## 2021-05-18 ENCOUNTER — Emergency Department (HOSPITAL_COMMUNITY)
Admission: EM | Admit: 2021-05-18 | Discharge: 2021-05-18 | Disposition: A | Discharge status: Home / Self Care | Payer: Self-pay | Attending: Emergency Medicine | Admitting: Emergency Medicine

## 2021-05-18 DIAGNOSIS — G5631 Lesion of radial nerve, right upper limb: Secondary | ICD-10-CM | POA: Insufficient documentation

## 2021-05-18 DIAGNOSIS — F17210 Nicotine dependence, cigarettes, uncomplicated: Secondary | ICD-10-CM | POA: Insufficient documentation

## 2021-05-18 NOTE — ED Triage Notes (Signed)
Right arm numb for 2 months, states she has trouble moving right wrist

## 2021-05-18 NOTE — Discharge Instructions (Signed)
Wear the splint, please follow-up with neurology.  I would take an anti-inflammatory like ibuprofen 400 mg 3 times a day until you follow-up.  This can be a chronic problem so it is essential that you see them this week.  Please call for an appointment in the morning

## 2021-05-18 NOTE — ED Provider Notes (Signed)
[facility] EMERGENCY DEPARTMENT Provider Note   CSN: [id number] Arrival date & time: 05/18/21  1443     History No chief complaint on file.   Penny Hale is a 35 y.o. female.  HPI  Patient is a 35 year old female, she has a history of multiple problems including polysubstance abuse seizure disorder which was drug-related, bipolar disorder and ADHD.  She presents to the hospital after waking up 2 months ago and noticing that she could not move her right hand.  She reports that she is able to grip and she is able to have normal sensation but she cannot lift her hand in a dorsiflexion mechanism.  She has no difficulty using her bicep tricep or shoulder muscles, it is purely the grip and the ability to extend at the wrist.  She states that she fell asleep in the sitting position with her hands underneath of her and slept all night long that way.  She has no other symptoms, she has not done anything about it, it has been 2 months of constant symptoms.  Past Medical History:  Diagnosis Date   ADHD (attention deficit hyperactivity disorder)    self-cutting   Bipolar 1 disorder (HCC)    Manic-depressive (HCC)    Polysubstance abuse (HCC)    cocaine, opiates, benzos, marijuana   Seizures (HCC)    drug related   Trichomonal vaginitis     Patient Active Problem List   Diagnosis Date Noted   Status post tubal ligation 02/08/2017   Superficial skin infection 02/08/2017   MRSA (methicillin resistant Staphylococcus aureus) infection 02/08/2017   Vaginal delivery 02/02/2017   Post term pregnancy at [redacted] weeks gestation 02/02/2017   Supervision of high risk pregnancy d/t cocaine use, antepartum 10/16/2016   History of pyelonephritis during pregnancy 10/16/2016   Family history of birth defect 10/16/2016   Smoker 10/16/2016   Depression with anxiety 10/16/2016   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) 02/12/2016   Cocaine abuse (HCC)  02/12/2016   Opioid use disorder, moderate, dependence (HCC) 02/12/2016   Polysubstance dependence including opioid type drug without complication, continuous use (HCC) 02/11/2016   GERD (gastroesophageal reflux disease) 12/20/2013   Benzodiazepine abuse (HCC) 10/03/2013   Marijuana use 10/03/2013    Past Surgical History:  Procedure Laterality Date   DILATION AND CURETTAGE OF UTERUS     RADIOLOGY WITH ANESTHESIA N/A 02/05/2017   Procedure: RADIOLOGY WITH ANESTHESIA - MRI LUMBAR SPINE WITH CONSTRAST;  Surgeon: Radiologist, Medication, MD;  Location: [facility] OR;  Service: Radiology;  Laterality: N/A;   TUBAL LIGATION N/A 02/03/2017   Procedure: POST PARTUM TUBAL LIGATION;  Surgeon: Levie Heritage, DO;  Location: [facility];  Service: Gynecology;  Laterality: N/A;     OB History     Gravida  8   Para  6   Term  6   Preterm      AB  2   Living  6      SAB  2   IAB      Ectopic      Multiple  0   Live Births  6           Family History  Problem Relation Age of Onset   COPD Mother    Emphysema Mother    Mental illness Sister    Depression Sister    Mental illness Brother    Depression Brother    ADD / ADHD Son  Social History   Tobacco Use   Smoking status: Every Day    Packs/day: 0.25    Years: 12.00    Pack years: 3.00    Types: Cigarettes   Smokeless tobacco: Never  Substance Use Topics   Alcohol use: Yes    Comment: alcohol use in early pregnancy   Drug use: Not Currently    Types: Cocaine, Marijuana, "Crack" cocaine, Oxycodone    Home Medications Prior to Admission medications   Medication Sig Start Date End Date Taking? Authorizing Provider  buprenorphine (SUBUTEX) 8 MG SUBL SL tablet Place 8 mg under the tongue every 8 (eight) hours.     [provider]  buPROPion (WELLBUTRIN) 100 MG tablet Take 1 tablet (100 mg total) by mouth 2 (two) times daily. 01/30/17   Lazaro Arms, MD  ibuprofen (ADVIL,MOTRIN) 600 MG tablet Take  1 tablet (600 mg total) by mouth every 6 (six) hours as needed. 02/08/17   Ignacia Marvel, MD  methocarbamol (ROBAXIN) 500 MG tablet Take 1 tablet (500 mg total) by mouth 3 (three) times daily. 02/08/17   White, Velta Addison, MD  oxyCODONE (OXY IR/ROXICODONE) 5 MG immediate release tablet Take 1-2 tablets (5-10 mg total) by mouth every 6 (six) hours as needed for severe pain. 02/08/17   Anyanwu, Jethro Bastos, MD  Prenatal Vit-Fe Fumarate-FA (PRENATAL VITAMIN) 27-0.8 MG TABS Take 1 tablet by mouth daily. 01/30/17   Lazaro Arms, MD    Allergies    Patient has no known allergies.  Review of Systems   Review of Systems  All other systems reviewed and are negative.  Physical Exam Updated Vital Signs BP 124/88   Pulse 91   Temp (!) 97.5 F (36.4 C) (Oral)   Resp 18   Ht 1.626 m (5\' 4" )   Wt 54.8 kg   LMP 05/16/2021   SpO2 97%   BMI 20.74 kg/m   Physical Exam Vitals and nursing note reviewed.  Constitutional:      General: She is not in acute distress.    Appearance: She is well-developed.  HENT:     Head: Normocephalic and atraumatic.     Mouth/Throat:     Pharynx: No oropharyngeal exudate.  Eyes:     General: No scleral icterus.       Right eye: No discharge.        Left eye: No discharge.     Conjunctiva/sclera: Conjunctivae normal.     Pupils: Pupils are equal, round, and reactive to light.  Neck:     Thyroid: No thyromegaly.     Vascular: No JVD.  Cardiovascular:     Rate and Rhythm: Normal rate and regular rhythm.     Heart sounds: Normal heart sounds. No murmur heard.   No friction rub. No gallop.  Pulmonary:     Effort: Pulmonary effort is normal. No respiratory distress.     Breath sounds: Normal breath sounds. No wheezing or rales.  Abdominal:     General: Bowel sounds are normal. There is no distension.     Palpations: Abdomen is soft. There is no mass.     Tenderness: There is no abdominal tenderness.  Musculoskeletal:        General: No tenderness. Normal range  of motion.     Cervical back: Normal range of motion and neck supple.  Lymphadenopathy:     Cervical: No cervical adenopathy.  Skin:    General: Skin is warm and dry.  Findings: No erythema or rash.  Neurological:     Mental Status: She is alert.     Coordination: Coordination normal.     Comments: The patient has normal facial symmetry, normal speech, normal coordination, she is able to lift both arms off of the bed with normal strength at the shoulders and the elbows with extension and flexion.  She is able to flex at the wrist bilaterally and she is able to grip bilaterally however the patient is not able to extend at the right wrist.  She is not able to raise the right thumb.  She has totally normal median and ulnar nerve function of the right hand.  Sensation seems to be intact to the right upper extremity.  Slight decrease sensation in the distribution of the radial nerve, bilateral lower extremities are normal, gait is normal  Psychiatric:        Behavior: Behavior normal.    ED Results / Procedures / Treatments   Labs (all labs ordered are listed, but only abnormal results are displayed) Labs Reviewed - No data to display  EKG None  Radiology No results found.  Procedures Procedures   Medications Ordered in ED Medications - No data to display  ED Course  I have reviewed the triage vital signs and the nursing notes.  Pertinent labs & imaging results that were available during my care of the patient were reviewed by me and considered in my medical decision making (see chart for details).    MDM Rules/Calculators/A&P                           Unfortunately for this right-hand-dominant patient she appears to have a radial nerve palsy.  This does not appear to be cerebral, she will be referred to local neurology placed in a splint and encouraged to use an anti-inflammatory.  She is agreeable  Final Clinical Impression(s) / ED Diagnoses Final diagnoses:  Radial  nerve palsy, right    Rx / DC Orders ED Discharge Orders     None        Eber Hong, MD 05/18/21 1527

## 2022-03-20 DIAGNOSIS — Z87891 Personal history of nicotine dependence: Secondary | ICD-10-CM | POA: Diagnosis not present

## 2022-03-20 DIAGNOSIS — Z5329 Procedure and treatment not carried out because of patient's decision for other reasons: Secondary | ICD-10-CM | POA: Diagnosis not present

## 2022-03-20 DIAGNOSIS — Z139 Encounter for screening, unspecified: Secondary | ICD-10-CM | POA: Diagnosis not present

## 2023-09-02 ENCOUNTER — Other Ambulatory Visit: Payer: Self-pay

## 2023-09-02 ENCOUNTER — Emergency Department (HOSPITAL_COMMUNITY)
Admission: EM | Admit: 2023-09-02 | Discharge: 2023-09-02 | Disposition: A | Discharge status: Home / Self Care | Payer: MEDICAID | Attending: Student | Admitting: Student

## 2023-09-02 ENCOUNTER — Emergency Department (HOSPITAL_COMMUNITY): Payer: MEDICAID

## 2023-09-02 ENCOUNTER — Encounter (HOSPITAL_COMMUNITY): Payer: Self-pay

## 2023-09-02 DIAGNOSIS — L03115 Cellulitis of right lower limb: Secondary | ICD-10-CM | POA: Insufficient documentation

## 2023-09-02 DIAGNOSIS — M7989 Other specified soft tissue disorders: Secondary | ICD-10-CM | POA: Diagnosis present

## 2023-09-02 LAB — CBC WITH DIFFERENTIAL/PLATELET
Abs Immature Granulocytes: 0 10*3/uL (ref 0.00–0.07)
Basophils Absolute: 0.1 10*3/uL (ref 0.0–0.1)
Basophils Relative: 2 %
Eosinophils Absolute: 0.3 10*3/uL (ref 0.0–0.5)
Eosinophils Relative: 8 %
HCT: 39.3 % (ref 36.0–46.0)
Hemoglobin: 12.9 g/dL (ref 12.0–15.0)
Immature Granulocytes: 0 %
Lymphocytes Relative: 31 %
Lymphs Abs: 1.3 10*3/uL (ref 0.7–4.0)
MCH: 29.6 pg (ref 26.0–34.0)
MCHC: 32.8 g/dL (ref 30.0–36.0)
MCV: 90.1 fL (ref 80.0–100.0)
Monocytes Absolute: 0.4 10*3/uL (ref 0.1–1.0)
Monocytes Relative: 9 %
Neutro Abs: 2.1 10*3/uL (ref 1.7–7.7)
Neutrophils Relative %: 50 %
Platelets: 241 10*3/uL (ref 150–400)
RBC: 4.36 MIL/uL (ref 3.87–5.11)
RDW: 12.3 % (ref 11.5–15.5)
WBC: 4.1 10*3/uL (ref 4.0–10.5)
nRBC: 0 % (ref 0.0–0.2)

## 2023-09-02 LAB — BASIC METABOLIC PANEL
Anion gap: 9 (ref 5–15)
BUN: 8 mg/dL (ref 6–20)
CO2: 29 mmol/L (ref 22–32)
Calcium: 8.9 mg/dL (ref 8.9–10.3)
Chloride: 101 mmol/L (ref 98–111)
Creatinine, Ser: 0.62 mg/dL (ref 0.44–1.00)
GFR, Estimated: >60 mL/min (ref >60)
Glucose, Bld: 132 mg/dL — ABNORMAL HIGH (ref 70–99)
Potassium: 4.1 mmol/L (ref 3.5–5.1)
Sodium: 139 mmol/L (ref 135–145)

## 2023-09-02 MED ORDER — CEPHALEXIN 500 MG PO CAPS: 500.0000 mg | ORAL_CAPSULE | Freq: Four times a day (QID) | ORAL | 0 refills | Status: AC

## 2023-09-02 MED ORDER — CEPHALEXIN 500 MG PO CAPS
500.0000 mg | ORAL_CAPSULE | Freq: Once | ORAL | Status: AC
  Administered 2023-09-02: 500 mg via ORAL
  Filled 2023-09-02: qty 1

## 2023-09-02 MED ORDER — KETOROLAC TROMETHAMINE 15 MG/ML IJ SOLN
15.0000 mg | Freq: Once | INTRAMUSCULAR | Status: DC
  Filled 2023-09-02: qty 1

## 2023-09-02 NOTE — ED Provider Notes (Signed)
 [place] EMERGENCY DEPARTMENT AT [facility] Provider Note   CSN: [id number] Arrival date & time: 09/02/23  1256     History {Add pertinent medical, surgical, social history, OB history to HPI:1} Chief Complaint  Patient presents with   Leg Swelling    Right leg    Penny Hale is a 38 y.o. female.  She has PMH of GERD, prior polysubstance abuse For right leg swelling and redness for the past several days.  She has history of psoriasis and does have some lesions in this area, denies injury or trauma, no fevers or chills, no numbness or tingling HPI     Home Medications Prior to Admission medications   Medication Sig Start Date End Date Taking? Authorizing Provider  buprenorphine (SUBUTEX) 8 MG SUBL SL tablet Place 8 mg under the tongue every 8 (eight) hours.     [provider]  buPROPion (WELLBUTRIN) 100 MG tablet Take 1 tablet (100 mg total) by mouth 2 (two) times daily. 01/30/17   Lazaro Arms, MD  ibuprofen (ADVIL,MOTRIN) 600 MG tablet Take 1 tablet (600 mg total) by mouth every 6 (six) hours as needed. 02/08/17   Ignacia Marvel, MD  methocarbamol (ROBAXIN) 500 MG tablet Take 1 tablet (500 mg total) by mouth 3 (three) times daily. 02/08/17   White, Velta Addison, MD  oxyCODONE (OXY IR/ROXICODONE) 5 MG immediate release tablet Take 1-2 tablets (5-10 mg total) by mouth every 6 (six) hours as needed for severe pain. 02/08/17   Anyanwu, Jethro Bastos, MD  Prenatal Vit-Fe Fumarate-FA (PRENATAL VITAMIN) 27-0.8 MG TABS Take 1 tablet by mouth daily. 01/30/17   Lazaro Arms, MD      Allergies    Patient has no known allergies.    Review of Systems   Review of Systems  Physical Exam Updated Vital Signs BP (!) 120/94 (BP Location: Right Arm)   Pulse (!) 108   Temp 99 F (37.2 C) (Temporal)   Resp 18   Ht 5\' 4"  (1.626 m)   Wt 55 kg   LMP 07/22/2023 (Exact Date)   SpO2 95%   BMI 20.81 kg/m  Physical Exam Vitals and nursing note reviewed.  Constitutional:       General: She is not in acute distress.    Appearance: She is well-developed.  HENT:     Head: Normocephalic and atraumatic.  Eyes:     Conjunctiva/sclera: Conjunctivae normal.  Cardiovascular:     Rate and Rhythm: Normal rate and regular rhythm.     Heart sounds: No murmur heard. Pulmonary:     Effort: Pulmonary effort is normal. No respiratory distress.     Breath sounds: Normal breath sounds.  Abdominal:     Palpations: Abdomen is soft.     Tenderness: There is no abdominal tenderness.  Musculoskeletal:        General: No swelling.     Cervical back: Neck supple.     Comments: Mild edema right distal lower leg from lower calf to foot with tenderness to the dorsum of the foot laterally.  DP and PT pulses intact on right foot.  Skin:    General: Skin is warm and dry.     Capillary Refill: Capillary refill takes less than 2 seconds.     Comments: Patchy erythema to the right lower leg and dorsum of the right foot.  No drainage.  Silver scaly patches to right anterior lateral lower leg  Neurological:     General: No  focal deficit present.     Mental Status: She is alert and oriented to person, place, and time.  Psychiatric:        Mood and Affect: Mood normal.     ED Results / Procedures / Treatments   Labs (all labs ordered are listed, but only abnormal results are displayed) Labs Reviewed  BASIC METABOLIC PANEL - Abnormal; Notable for the following components:      Result Value   Glucose, Bld 132 (*)    All other components within normal limits  CBC WITH DIFFERENTIAL/PLATELET    EKG None  Radiology US Venous Img Lower Right (DVT Study) Result Date: 09/02/2023 CLINICAL DATA:  38 year old female with right lower extremity edema. EXAM: RIGHT LOWER EXTREMITY VENOUS DOPPLER ULTRASOUND TECHNIQUE: Gray-scale sonography with graded compression, as well as color Doppler and duplex ultrasound were performed to evaluate the right lower extremity deep venous systems from the  level of the common femoral vein and including the common femoral, femoral, profunda femoral, popliteal and calf veins including the posterior tibial, peroneal and gastrocnemius veins when visible. Spectral Doppler was utilized to evaluate flow at rest and with distal augmentation maneuvers in the common femoral, femoral and popliteal veins. The contralateral common femoral vein was also evaluated for comparison. COMPARISON:  None Available. FINDINGS: RIGHT LOWER EXTREMITY Common Femoral Vein: No evidence of thrombus. Normal compressibility, respiratory phasicity and response to augmentation. Central Greater Saphenous Vein: No evidence of thrombus. Normal compressibility and flow on color Doppler imaging. Central Profunda Femoral Vein: No evidence of thrombus. Normal compressibility and flow on color Doppler imaging. Femoral Vein: No evidence of thrombus. Normal compressibility, respiratory phasicity and response to augmentation. Popliteal Vein: No evidence of thrombus. Normal compressibility, respiratory phasicity and response to augmentation. Calf Veins: No evidence of thrombus. Normal compressibility and flow on color Doppler imaging. Other Findings:  None. LEFT LOWER EXTREMITY Common Femoral Vein: No evidence of thrombus. Normal compressibility, respiratory phasicity and response to augmentation. IMPRESSION: No evidence of right lower extremity deep venous thrombosis. Marliss Coots, MD [facility] Radiology Electronically Signed   By: Marliss Coots M.D.   On: 09/02/2023 15:33    Procedures Procedures  {Document cardiac monitor, telemetry assessment procedure when appropriate:1}  Medications Ordered in ED Medications - No data to display  ED Course/ Medical Decision Making/ A&P   {   Click here for ABCD2, HEART and other calculatorsREFRESH Note before signing :1}                              Medical Decision Making Amount and/or Complexity of Data  Reviewed Labs: ordered.  Risk Prescription drug management.   ***  {Document critical care time when appropriate:1} {Document review of labs and clinical decision tools ie heart score, Chads2Vasc2 etc:1}  {Document your independent review of radiology images, and any outside records:1} {Document your discussion with family members, caretakers, and with consultants:1} {Document social determinants of health affecting pt's care:1} {Document your decision making why or why not admission, treatments were needed:1} Final Clinical Impression(s) / ED Diagnoses Final diagnoses:  None    Rx / DC Orders ED Discharge Orders     None

## 2023-09-02 NOTE — ED Provider Triage Note (Signed)
 Emergency Medicine Provider Triage Evaluation Note  Penny Hale , a 38 y.o. female  was evaluated in triage.  Pt complains of right leg pain redness x 2 days.  No injury or trauma..  Review of Systems  Positive: Right leg pain and swelling Negative: Fever, chills  Physical Exam  BP (!) 120/94 (BP Location: Right Arm)   Pulse (!) 108   Temp 99 F (37.2 C) (Temporal)   Resp 18   Ht 5\' 4"  (1.626 m)   Wt 55 kg   LMP 07/22/2023 (Exact Date)   SpO2 95%   BMI 20.81 kg/m  Gen:   Awake, no distress   Resp:  Normal effort  MSK:   Moves extremities without difficulty  Other:    Medical Decision Making  Medically screening exam initiated at 1:47 PM.  Appropriate orders placed.  SHARECE FLEISCHHACKER was informed that the remainder of the evaluation will be completed by another provider, this initial triage assessment does not replace that evaluation, and the importance of remaining in the ED until their evaluation is complete.     Ma Rings, [place] 09/02/23 1348

## 2023-09-02 NOTE — Discharge Instructions (Signed)
 It was a pleasure taking care of you today.  You are seen in the ER evaluation of redness and swelling to your right leg and foot.  Your exam is consistent with a skin infection called cellulitis.  Your blood work was all reassuring.  We did an ultrasound that was negative for a blood clot.  Take over-the-counter Tylenol and ibuprofen as needed for discomfort, keep your leg elevated above heart level as much as possible to help with the swelling and pain.  Follow-up close with your primary care doctor.  If you have increased redness, increased swelling, develop a fever or have any other new or worsening symptoms come back to the ER right away.

## 2023-09-02 NOTE — ED Triage Notes (Signed)
 Pt arrived via POV from home c/o right lower extremity swelling. Pt denies injury. Pt endorses mild pain with ambulation. Pt presents with redness and swelling in her leg.

## 2023-09-02 NOTE — ED Notes (Signed)
 Patient has swelling to right foot and calf, no redness or warmth noted. Pulses normal.
